# Patient Record
Sex: Female | Born: 1945 | Race: White | Hispanic: No | Marital: Married | Smoking: Former smoker
Health system: Southern US, Community
[De-identification: ages and names within clinical notes are randomized; demographics above are authoritative.]

## PROBLEM LIST (undated history)

## (undated) DIAGNOSIS — E7800 Pure hypercholesterolemia, unspecified: Secondary | ICD-10-CM

## (undated) DIAGNOSIS — G4730 Sleep apnea, unspecified: Secondary | ICD-10-CM

## (undated) DIAGNOSIS — K219 Gastro-esophageal reflux disease without esophagitis: Secondary | ICD-10-CM

## (undated) DIAGNOSIS — M1990 Unspecified osteoarthritis, unspecified site: Secondary | ICD-10-CM

## (undated) DIAGNOSIS — E119 Type 2 diabetes mellitus without complications: Secondary | ICD-10-CM

## (undated) DIAGNOSIS — M4316 Spondylolisthesis, lumbar region: Secondary | ICD-10-CM

## (undated) DIAGNOSIS — I10 Essential (primary) hypertension: Secondary | ICD-10-CM

## (undated) DIAGNOSIS — C801 Malignant (primary) neoplasm, unspecified: Secondary | ICD-10-CM

## (undated) HISTORY — PX: EYE SURGERY: SHX253

## (undated) HISTORY — PX: COLONOSCOPY: SHX174

## (undated) HISTORY — PX: TONSILLECTOMY: SUR1361

## (undated) HISTORY — PX: OTHER SURGICAL HISTORY: SHX169

## (undated) HISTORY — PX: APPENDECTOMY: SHX54

## (undated) HISTORY — PX: ABDOMINAL HYSTERECTOMY: SHX81

---

## 2004-08-23 DIAGNOSIS — C801 Malignant (primary) neoplasm, unspecified: Secondary | ICD-10-CM

## 2004-08-23 HISTORY — DX: Malignant (primary) neoplasm, unspecified: C80.1

## 2016-02-09 ENCOUNTER — Telehealth: Payer: Self-pay

## 2016-02-09 ENCOUNTER — Other Ambulatory Visit: Payer: Self-pay

## 2016-02-09 DIAGNOSIS — Z1211 Encounter for screening for malignant neoplasm of colon: Secondary | ICD-10-CM

## 2016-02-09 MED ORDER — PEG 3350-KCL-NA BICARB-NACL 420 G PO SOLR: 4000.0000 mL | ORAL | Status: DC

## 2016-02-09 NOTE — Telephone Encounter (Signed)
Ok to schedule.

## 2016-02-09 NOTE — Telephone Encounter (Signed)
Gastroenterology Pre-Procedure Review  Request Date: Requesting Physician:   PATIENT REVIEW QUESTIONS: The patient responded to the following health history questions as indicated:    1. Diabetes Melitis: NO 2. Joint replacements in the past 12 months: NO 3. Major health problems in the past 3 months: NO 4. Has an artificial valve or MVP: NO 5. Has a defibrillator: NO 6. Has been advised in past to take antibiotics in advance of a procedure like teeth cleaning: NO 7. Family history of colon cancer: NO 8. Alcohol Use: NO 9. History of sleep apnea: NO    MEDICATIONS & ALLERGIES:    Patient reports the following regarding taking any blood thinners:   Plavix? NO Aspirin? YES Coumadin? NO  Patient confirms/reports the following medications:  Current Outpatient Prescriptions  Medication Sig Dispense Refill  . aspirin 81 MG tablet Take 81 mg by mouth daily.    . DULoxetine (CYMBALTA) 30 MG capsule Take 30 mg by mouth daily.    Marland Kitchen estradiol (ESTRACE) 2 MG tablet Take 2 mg by mouth daily.    Marland Kitchen losartan-hydrochlorothiazide (HYZAAR) 50-12.5 MG tablet Take 1 tablet by mouth daily.    . Omega-3 Fatty Acids (OMEGA-3 FISH OIL PO) Take by mouth.    Marland Kitchen omeprazole (PRILOSEC) 40 MG capsule Take 40 mg by mouth daily.    . simvastatin (ZOCOR) 40 MG tablet Take 40 mg by mouth daily.     No current facility-administered medications for this visit.    Patient confirms/reports the following allergies:  Allergies not on file  No orders of the defined types were placed in this encounter.    AUTHORIZATION INFORMATION Primary Insurance: Medicare,  ID #[id number],  Group #:  Pre-Cert / Auth required:  Pre-Cert / Auth #:   Secondary Insurance: Arrie Aran,  [place] #[id number],  Group #: [place] Pre-Cert / Auth required:  Pre-Cert / Auth #:   SCHEDULE INFORMATION: Procedure has been scheduled as follows:  Date: , Time:   Location:   This Gastroenterology Pre-Precedure Review Form is being routed  to the following provider:

## 2016-02-09 NOTE — Telephone Encounter (Signed)
Pt is set up for  02/18/16 @ 1:00 pm. Instructions are in the mail and she is aware of date and time.

## 2016-02-12 ENCOUNTER — Telehealth: Payer: Self-pay

## 2016-02-12 NOTE — Telephone Encounter (Signed)
Tried to call with no answer  

## 2016-02-18 ENCOUNTER — Encounter (HOSPITAL_COMMUNITY): Payer: Self-pay | Admitting: *Deleted

## 2016-02-18 ENCOUNTER — Encounter (HOSPITAL_COMMUNITY)
Admission: RE | Disposition: A | Discharge status: Home / Self Care | Payer: Self-pay | Source: Ambulatory Visit | Attending: Gastroenterology

## 2016-02-18 ENCOUNTER — Ambulatory Visit (HOSPITAL_COMMUNITY)
Admission: RE | Admit: 2016-02-18 | Discharge: 2016-02-18 | Disposition: A | Discharge status: Home / Self Care | Payer: Medicare Other | Source: Ambulatory Visit | Attending: Gastroenterology | Admitting: Gastroenterology

## 2016-02-18 DIAGNOSIS — Q438 Other specified congenital malformations of intestine: Secondary | ICD-10-CM | POA: Diagnosis not present

## 2016-02-18 DIAGNOSIS — K5730 Diverticulosis of large intestine without perforation or abscess without bleeding: Secondary | ICD-10-CM | POA: Insufficient documentation

## 2016-02-18 DIAGNOSIS — Z1211 Encounter for screening for malignant neoplasm of colon: Secondary | ICD-10-CM | POA: Insufficient documentation

## 2016-02-18 DIAGNOSIS — E7800 Pure hypercholesterolemia, unspecified: Secondary | ICD-10-CM | POA: Insufficient documentation

## 2016-02-18 DIAGNOSIS — Z7982 Long term (current) use of aspirin: Secondary | ICD-10-CM | POA: Diagnosis not present

## 2016-02-18 DIAGNOSIS — K219 Gastro-esophageal reflux disease without esophagitis: Secondary | ICD-10-CM | POA: Insufficient documentation

## 2016-02-18 DIAGNOSIS — Z8542 Personal history of malignant neoplasm of other parts of uterus: Secondary | ICD-10-CM | POA: Diagnosis not present

## 2016-02-18 DIAGNOSIS — I10 Essential (primary) hypertension: Secondary | ICD-10-CM | POA: Diagnosis not present

## 2016-02-18 DIAGNOSIS — K648 Other hemorrhoids: Secondary | ICD-10-CM | POA: Diagnosis not present

## 2016-02-18 DIAGNOSIS — Z79899 Other long term (current) drug therapy: Secondary | ICD-10-CM | POA: Diagnosis not present

## 2016-02-18 DIAGNOSIS — Z87891 Personal history of nicotine dependence: Secondary | ICD-10-CM | POA: Diagnosis not present

## 2016-02-18 DIAGNOSIS — K621 Rectal polyp: Secondary | ICD-10-CM | POA: Diagnosis not present

## 2016-02-18 HISTORY — DX: Pure hypercholesterolemia, unspecified: E78.00

## 2016-02-18 HISTORY — DX: Malignant (primary) neoplasm, unspecified: C80.1

## 2016-02-18 HISTORY — DX: Essential (primary) hypertension: I10

## 2016-02-18 HISTORY — PX: COLONOSCOPY: SHX5424

## 2016-02-18 HISTORY — DX: Gastro-esophageal reflux disease without esophagitis: K21.9

## 2016-02-18 SURGERY — COLONOSCOPY
Anesthesia: Moderate Sedation

## 2016-02-18 MED ORDER — SODIUM CHLORIDE 0.9 % IV SOLN
INTRAVENOUS | Status: DC
  Administered 2016-02-18: 12:00:00 via INTRAVENOUS

## 2016-02-18 MED ORDER — MEPERIDINE HCL 100 MG/ML IJ SOLN
INTRAMUSCULAR | Status: AC
  Filled 2016-02-18: qty 2

## 2016-02-18 MED ORDER — MIDAZOLAM HCL 5 MG/5ML IJ SOLN
INTRAMUSCULAR | Status: DC
Start: 2016-02-18 — End: 2016-02-18
  Filled 2016-02-18: qty 10

## 2016-02-18 MED ORDER — MIDAZOLAM HCL 5 MG/5ML IJ SOLN
INTRAMUSCULAR | Status: DC | PRN
  Administered 2016-02-18 (×2): 2 mg via INTRAVENOUS
  Administered 2016-02-18 (×2): 1 mg via INTRAVENOUS

## 2016-02-18 MED ORDER — STERILE WATER FOR IRRIGATION IR SOLN
Status: DC | PRN
  Administered 2016-02-18: 13:00:00

## 2016-02-18 MED ORDER — MEPERIDINE HCL 100 MG/ML IJ SOLN
INTRAMUSCULAR | Status: DC | PRN
  Administered 2016-02-18: 50 mg via INTRAVENOUS
  Administered 2016-02-18 (×2): 25 mg via INTRAVENOUS

## 2016-02-18 NOTE — Discharge Instructions (Signed)
You have small internal hemorrhoids and diverticulosis IN YOUR LEFT COLON. YOU HAD TWO SMALL POLYPS REMOVED.   DRINK WATER TO KEEP YOUR URINE LIGHT YELLOW.  FOLLOW A HIGH FIBER DIET. AVOID ITEMS THAT CAUSE BLOATING. See info below.  CONTINUE YOUR WEIGHT LOSS EFFORTS. LOSE TEN POUNDS. YOUR BODY MASS INDEX IS OVER 30 WHICH MEANS YOU ARE OBESE. OBESITY IS ASSOCIATED WITH AN INCREASE FOR ALL CANCERS, INCLUDING ESOPHAGEAL AND COLON CANCER.  Next colonoscopy in 5-10 years.  Colonoscopy Care After Read the instructions outlined below and refer to this sheet in the next week. These discharge instructions provide you with general information on caring for yourself after you leave the hospital. While your treatment has been planned according to the most current medical practices available, unavoidable complications occasionally occur. If you have any problems or questions after discharge, call DR. Kelyn Koskela, [phone].  ACTIVITY  You may resume your regular activity, but move at a slower pace for the next 24 hours.   Take frequent rest periods for the next 24 hours.   Walking will help get rid of the air and reduce the bloated feeling in your belly (abdomen).   No driving for 24 hours (because of the medicine (anesthesia) used during the test).   You may shower.   Do not sign any important legal documents or operate any machinery for 24 hours (because of the anesthesia used during the test).    NUTRITION  Drink plenty of fluids.   You may resume your normal diet as instructed by your doctor.   Begin with a light meal and progress to your normal diet. Heavy or fried foods are harder to digest and may make you feel sick to your stomach (nauseated).   Avoid alcoholic beverages for 24 hours or as instructed.    MEDICATIONS  You may resume your normal medications.   WHAT YOU CAN EXPECT TODAY  Some feelings of bloating in the abdomen.   Passage of more gas than usual.   Spotting  of blood in your stool or on the toilet paper  .  IF YOU HAD POLYPS REMOVED DURING THE COLONOSCOPY:  Eat a soft diet IF YOU HAVE NAUSEA, BLOATING, ABDOMINAL PAIN, OR VOMITING.    FINDING OUT THE RESULTS OF YOUR TEST Not all test results are available during your visit. DR. Oneida Alar WILL CALL YOU WITHIN 14 DAYS OF YOUR PROCEDUE WITH YOUR RESULTS. Do not assume everything is normal if you have not heard from DR. Jaryiah Mehlman, CALL HER OFFICE AT [phone].  SEEK IMMEDIATE MEDICAL ATTENTION AND CALL THE OFFICE: [phone] IF:  You have more than a spotting of blood in your stool.   Your belly is swollen (abdominal distention).   You are nauseated or vomiting.   You have a temperature over 101F.   You have abdominal pain or discomfort that is severe or gets worse throughout the day.  High-Fiber Diet A high-fiber diet changes your normal diet to include more whole grains, legumes, fruits, and vegetables. Changes in the diet involve replacing refined carbohydrates with unrefined foods. The calorie level of the diet is essentially unchanged. The Dietary Reference Intake (recommended amount) for adult males is 38 grams per day. For adult females, it is 25 grams per day. Pregnant and lactating women should consume 28 grams of fiber per day. Fiber is the intact part of a plant that is not broken down during digestion. Functional fiber is fiber that has been isolated from the plant to provide a beneficial effect  in the body. PURPOSE  Increase stool bulk.   Ease and regulate bowel movements.   Lower cholesterol.  REDUCE RISK OF COLON CANCER  INDICATIONS THAT YOU NEED MORE FIBER  Constipation and hemorrhoids.   Uncomplicated diverticulosis (intestine condition) and irritable bowel syndrome.   Weight management.   As a protective measure against hardening of the arteries (atherosclerosis), diabetes, and cancer.   GUIDELINES FOR INCREASING FIBER IN THE DIET  Start adding fiber to the diet  slowly. A gradual increase of about 5 more grams (2 slices of whole-wheat bread, 2 servings of most fruits or vegetables, or 1 bowl of high-fiber cereal) per day is best. Too rapid an increase in fiber may result in constipation, flatulence, and bloating.   Drink enough water and fluids to keep your urine clear or pale yellow. Water, juice, or caffeine-free drinks are recommended. Not drinking enough fluid may cause constipation.   Eat a variety of high-fiber foods rather than one type of fiber.   Try to increase your intake of fiber through using high-fiber foods rather than fiber pills or supplements that contain small amounts of fiber.   The goal is to change the types of food eaten. Do not supplement your present diet with high-fiber foods, but replace foods in your present diet.   INCLUDE A VARIETY OF FIBER SOURCES  Replace refined and processed grains with whole grains, canned fruits with fresh fruits, and incorporate other fiber sources. White rice, white breads, and most bakery goods contain little or no fiber.   Brown whole-grain rice, buckwheat oats, and many fruits and vegetables are all good sources of fiber. These include: broccoli, Brussels sprouts, cabbage, cauliflower, beets, sweet potatoes, white potatoes (skin on), carrots, tomatoes, eggplant, squash, berries, fresh fruits, and dried fruits.   Cereals appear to be the richest source of fiber. Cereal fiber is found in whole grains and bran. Bran is the fiber-rich outer coat of cereal grain, which is largely removed in refining. In whole-grain cereals, the bran remains. In breakfast cereals, the largest amount of fiber is found in those with "bran" in their names. The fiber content is sometimes indicated on the label.   You may need to include additional fruits and vegetables each day.   In baking, for 1 cup white flour, you may use the following substitutions:   1 cup whole-wheat flour minus 2 tablespoons.   1/2 cup white  flour plus 1/2 cup whole-wheat flour.   Polyps, Colon  A polyp is extra tissue that grows inside your body. Colon polyps grow in the large intestine. The large intestine, also called the colon, is part of your digestive system. It is a long, hollow tube at the end of your digestive tract where your body makes and stores stool. Most polyps are not dangerous. They are benign. This means they are not cancerous. But over time, some types of polyps can turn into cancer. Polyps that are smaller than a pea are usually not harmful. But larger polyps could someday become or may already be cancerous. To be safe, doctors remove all polyps and test them.   PREVENTION There is not one sure way to prevent polyps. You might be able to lower your risk of getting them if you:  Eat more fruits and vegetables and less fatty food.   Do not smoke.   Avoid alcohol.   Exercise every day.   Lose weight if you are overweight.   Eating more calcium and folate can also lower your  risk of getting polyps. Some foods that are rich in calcium are milk, cheese, and broccoli. Some foods that are rich in folate are chickpeas, kidney beans, and spinach.    Diverticulosis Diverticulosis is a common condition that develops when small pouches (diverticula) form in the wall of the colon. The risk of diverticulosis increases with age. It happens more often in people who eat a low-fiber diet. Most individuals with diverticulosis have no symptoms. Those individuals with symptoms usually experience belly (abdominal) pain, constipation, or loose stools (diarrhea).  HOME CARE INSTRUCTIONS  Increase the amount of fiber in your diet as directed by your caregiver or dietician. This may reduce symptoms of diverticulosis.   Drink at least 6 to 8 glasses of water each day to prevent constipation.   Try not to strain when you have a bowel movement.   Avoiding nuts and seeds to prevent complications is NOT NECESSARY.   FOODS HAVING  HIGH FIBER CONTENT INCLUDE:  Fruits. Apple, peach, pear, tangerine, raisins, prunes.   Vegetables. Brussels sprouts, asparagus, broccoli, cabbage, carrot, cauliflower, romaine lettuce, spinach, summer squash, tomato, winter squash, zucchini.   Starchy Vegetables. Baked beans, kidney beans, lima beans, split peas, lentils, potatoes (with skin).   Grains. Whole wheat bread, brown rice, bran flake cereal, plain oatmeal, white rice, shredded wheat, bran muffins.    SEEK IMMEDIATE MEDICAL CARE IF:  You develop increasing pain or severe bloating.   You have an oral temperature above 101F.   You develop vomiting or bowel movements that are bloody or black.   Hemorrhoids Hemorrhoids are dilated (enlarged) veins around the rectum. Sometimes clots will form in the veins. This makes them swollen and painful. These are called thrombosed hemorrhoids. Causes of hemorrhoids include:  Constipation.   Straining to have a bowel movement.   HEAVY LIFTING  HOME CARE INSTRUCTIONS  Eat a well balanced diet and drink 6 to 8 glasses of water every day to avoid constipation. You may also use a bulk laxative.   Avoid straining to have bowel movements.   Keep anal area dry and clean.   Do not use a donut shaped pillow or sit on the toilet for long periods. This increases blood pooling and pain.   Move your bowels when your body has the urge; this will require less straining and will decrease pain and pressure.

## 2016-02-18 NOTE — Op Note (Signed)
[facility] Patient Name: Phyllis Bell Procedure Date: 02/18/2016 12:42 PM MRN: [id number] Date of Birth: 01-02-1946 Attending MD: Barney Drain , MD CSN: [id number] Age: 70 Admit Type: Outpatient Procedure:                Colonoscopy WITH COLD FORCEPS POLYPECTOMY Indications:              Screening for colorectal malignant neoplasm Providers:                Barney Drain, MD, Otis Peak B. Gwenlyn Perking RN, RN, Randa Spike, Technician Referring MD:             out of [facility] Medicines:                Meperidine 100 mg IV, Midazolam 6 mg IV Complications:            No immediate complications. Estimated Blood Loss:     Estimated blood loss was minimal. Procedure:                Pre-Anesthesia Assessment:                           - Prior to the procedure, a History and Physical                            was performed, and patient medications and                            allergies were reviewed. The patient's tolerance of                            previous anesthesia was also reviewed. The risks                            and benefits of the procedure and the sedation                            options and risks were discussed with the patient.                            All questions were answered, and informed consent                            was obtained. Prior Anticoagulants: The patient has                            taken aspirin, last dose was day of procedure. ASA                            Grade Assessment: II - A patient with mild systemic                            disease. After reviewing the risks and benefits,  the patient was deemed in satisfactory condition to                            undergo the procedure. After obtaining informed                            consent, the colonoscope was passed under direct                            vision. Throughout the procedure, the patient's                            blood  pressure, pulse, and oxygen saturations were                            monitored continuously. The [id number]                            scope was introduced through the anus and advanced                            to the the cecum, identified by appendiceal orifice                            and ileocecal valve. The colonoscopy was somewhat                            difficult due to significant looping. Successful                            completion of the procedure was aided by changing                            the patient to a supine position, using manual                            pressure and COLOWRAP. The ileocecal valve,                            appendiceal orifice, and rectum were photographed.                            The patient tolerated the procedure fairly well.                            The quality of the bowel preparation was excellent. Scope In: 1:16:47 PM Scope Out: 1:46:44 PM Scope Withdrawal Time: 0 hours 10 minutes 39 seconds  Total Procedure Duration: 0 hours 29 minutes 57 seconds  Findings:      The recto-sigmoid colon, sigmoid colon and transverse colon were       moderately redundant.      Two sessile polyps were found in the rectum. The polyps were 2 to 3 mm       in size. These polyps were removed  with a cold biopsy forceps. Resection       and retrieval were complete.      Many small and large-mouthed diverticula were found in the sigmoid colon.      Non-bleeding internal hemorrhoids were found. The hemorrhoids were small. Impression:               - Redundant colon.                           - Two 2 to 3 mm polyps in the rectum, removed with                            a cold biopsy forceps. Resected and retrieved.                           - Diverticulosis in the sigmoid colon.                           - Non-bleeding internal hemorrhoids. Moderate Sedation:      Moderate (conscious) sedation was administered by the endoscopy nurse        and supervised by the endoscopist. The following parameters were       monitored: oxygen saturation, heart rate, blood pressure, and response       to care. Total physician intraservice time was 49 minutes. Recommendation:           - High fiber diet.                           - Continue present medications.                           - Await pathology results.                           - Repeat colonoscopy in 5-10 years for surveillance.                           - Patient has a contact number available for                            emergencies. The signs and symptoms of potential                            delayed complications were discussed with the                            patient. Return to normal activities tomorrow.                            Written discharge instructions were provided to the                            patient. Procedure Code(s):        --- Professional ---  [id number], Colonoscopy, flexible; with biopsy, single                            or multiple                           99152, Moderate sedation services provided by the                            same physician or other qualified health care                            professional performing the diagnostic or                            therapeutic service that the sedation supports,                            requiring the presence of an independent trained                            observer to assist in the monitoring of the                            patient's level of consciousness and physiological                            status; initial 15 minutes of intraservice time,                            patient age 17 years or older                           [phone], Moderate sedation services; each additional                            15 minutes intraservice time                           99153, Moderate sedation services; each additional                            15 minutes  intraservice time Diagnosis Code(s):        --- Professional ---                           Z12.11, Encounter for screening for malignant                            neoplasm of colon                           K62.1, Rectal polyp                           K64.8, Other hemorrhoids  K57.30, Diverticulosis of large intestine without                            perforation or abscess without bleeding                           Q43.8, Other specified congenital malformations of                            intestine CPT copyright 2016 [employer]. All rights reserved. The codes documented in this report are preliminary and upon coder review may  be revised to meet current compliance requirements. Barney Drain, MD Barney Drain, MD 02/18/2016 5:53:23 PM This report has been signed electronically. Number of Addenda: 0

## 2016-02-18 NOTE — H&P (Signed)
  Primary Care Physician:  Pcp Not In System Primary Gastroenterologist:  Dr. Oneida Alar  Pre-Procedure History & Physical: HPI:  Phyllis Bell is a 70 y.o. female here for [place].  Past Medical History  Diagnosis Date  . Hypertension   . Hypercholesteremia   . GERD (gastroesophageal reflux disease)   . Cancer [facility]) 2006    Uterine cancer    Past Surgical History  Procedure Laterality Date  . Abdominal hysterectomy    . Incisional hernia repair with mesh    . Colonoscopy      Prior to Admission medications   Medication Sig Start Date End Date Taking? Authorizing Provider  aspirin 81 MG tablet Take 81 mg by mouth daily.   Yes Historical Provider, MD  DULoxetine (CYMBALTA) 30 MG capsule Take 30 mg by mouth daily.   Yes Historical Provider, MD  estradiol (ESTRACE) 2 MG tablet Take 2 mg by mouth daily.   Yes Historical Provider, MD  losartan-hydrochlorothiazide (HYZAAR) 50-12.5 MG tablet Take 1 tablet by mouth daily.   Yes Historical Provider, MD  meloxicam (MOBIC) 15 MG tablet Take 15 mg by mouth daily.   Yes Historical Provider, MD  Omega-3 Fatty Acids (OMEGA-3 FISH OIL PO) Take 2 capsules by mouth daily.    Yes Historical Provider, MD  omeprazole (PRILOSEC) 40 MG capsule Take 40 mg by mouth daily.   Yes Historical Provider, MD  polyethylene glycol-electrolytes (TRILYTE) 420 g solution Take 4,000 mLs by mouth as directed. 02/09/16  Yes Danie Binder, MD  simvastatin (ZOCOR) 40 MG tablet Take 40 mg by mouth daily.   Yes Historical Provider, MD    Allergies as of 02/09/2016  . (Not on File)    History reviewed. No pertinent family history.  Social History   Social History  . Marital Status: Married    Spouse Name: N/A  . Number of Children: N/A  . Years of Education: N/A   Occupational History  . Not on file.   Social History Main Topics  . Smoking status: Former Smoker -- 1.00 packs/day for 20 years    Types: Cigarettes  . Smokeless tobacco: Not on  file  . Alcohol Use: No  . Drug Use: No  . Sexual Activity: Not on file   Other Topics Concern  . Not on file   Social History Narrative  . No narrative on file    Review of Systems: See HPI, otherwise negative ROS   Physical Exam: BP 134/85 mmHg  Pulse 63  Temp(Src) 97.7 F (36.5 C) (Oral)  Resp 13  Ht 6' (1.829 m)  Wt 224 lb (101.606 kg)  BMI 30.37 kg/m2  SpO2 99% General:   Alert,  pleasant and cooperative in NAD Head:  Normocephalic and atraumatic. Neck:  Supple; Lungs:  Clear throughout to auscultation.    Heart:  Regular rate and rhythm. Abdomen:  Soft, nontender and nondistended. Normal bowel sounds, without guarding, and without rebound.   Neurologic:  Alert and  oriented x4;  grossly normal neurologically.  Impression/Plan:     SCREENING  Plan:  1. TCS TODAY

## 2016-02-23 ENCOUNTER — Encounter (HOSPITAL_COMMUNITY): Payer: Self-pay | Admitting: Gastroenterology

## 2016-03-09 ENCOUNTER — Telehealth: Payer: Self-pay | Admitting: Gastroenterology

## 2016-03-09 NOTE — Telephone Encounter (Signed)
Please call pt. She had HYPERPLASTIC POLYPS removed.   DRINK WATER TO KEEP YOUR URINE LIGHT YELLOW.  FOLLOW A HIGH FIBER DIET. AVOID ITEMS THAT CAUSE BLOATING.   CONTINUE YOUR WEIGHT LOSS EFFORTS. LOSE TEN POUNDS.   Next colonoscopy in 10 years.

## 2016-03-10 NOTE — Telephone Encounter (Signed)
Tried to call with no answer  

## 2016-03-10 NOTE — Telephone Encounter (Signed)
Reminder in epic °

## 2016-03-11 NOTE — Telephone Encounter (Signed)
Pt is aware of results. I printed the info she needed to sign up for MY CHART and mailed to her.

## 2017-06-23 ENCOUNTER — Other Ambulatory Visit: Payer: Self-pay | Admitting: Neurosurgery

## 2017-06-23 DIAGNOSIS — M48062 Spinal stenosis, lumbar region with neurogenic claudication: Secondary | ICD-10-CM

## 2017-07-11 ENCOUNTER — Ambulatory Visit
Admission: RE | Admit: 2017-07-11 | Discharge: 2017-07-11 | Disposition: A | Discharge status: Home / Self Care | Payer: Medicare Other | Source: Ambulatory Visit | Attending: Neurosurgery | Admitting: Neurosurgery

## 2017-07-11 DIAGNOSIS — M48062 Spinal stenosis, lumbar region with neurogenic claudication: Secondary | ICD-10-CM

## 2017-08-08 ENCOUNTER — Other Ambulatory Visit: Payer: Self-pay | Admitting: Neurosurgery

## 2017-09-16 NOTE — Pre-Procedure Instructions (Signed)
Phyllis Bell  09/16/2017      [id number] - [address], [place] [address] Phone: [phone] Fax: [phone]  [id number] - [address] [place] [address] [place] [address] Phone: [phone] Fax: [phone]  [id number] [address], [place] [address] [place] [address] Phone: [phone] Fax: [phone]    Your procedure is scheduled on February 5  Report to [place] at [place].M.  Call this number if you have problems the morning of surgery:  [phone]   Remember:  Do not eat food or drink liquids after midnight.  Continue all medications as directed by your physician except follow these medication instructions before surgery below    Take these medicines the morning of surgery with A SIP OF WATER  omeprazole (PRILOSEC)  7 days prior to surgery STOP taking any Aspirin(unless otherwise instructed by your surgeon), Aleve, Naproxen, Ibuprofen, Motrin, Advil, Goody's, BC's, all herbal medications, fish oil, and all vitamins meloxicam (MOBIC)   Follow your doctors instructions regarding your Aspirin.  If no instructions were given by your doctor, then you will need to call the prescribing office office to get instructions.     WHAT DO I DO ABOUT MY DIABETES MEDICATION?   Marland Kitchen Do not take oral diabetes medicines (pills) the morning of surgery. metFORMIN (GLUCOPHAGE)     How to Manage Your Diabetes Before and After Surgery  Why is it important to control my blood sugar before and after surgery? . Improving blood sugar levels before and after surgery helps healing and can limit problems. . A way of improving blood sugar control is eating a healthy diet by: o  Eating less sugar and carbohydrates o  Increasing activity/exercise o  Talking with your doctor about reaching  your blood sugar goals . High blood sugars (greater than 180 mg/dL) can raise your risk of infections and slow your recovery, so you will need to focus on controlling your diabetes during the weeks before surgery. . Make sure that the doctor who takes care of your diabetes knows about your planned surgery including the date and location.  How do I manage my blood sugar before surgery? . Check your blood sugar at least 4 times a day, starting 2 days before surgery, to make sure that the level is not too high or low. o Check your blood sugar the morning of your surgery when you wake up and every 2 hours until you get to the Short Stay unit. . If your blood sugar is less than 70 mg/dL, you will need to treat for low blood sugar: o Do not take insulin. o Treat a low blood sugar (less than 70 mg/dL) with  cup of clear juice (cranberry or apple), 4 glucose tablets, OR glucose gel. o Recheck blood sugar in 15 minutes after treatment (to make sure it is greater than 70 mg/dL). If your blood sugar is not greater than 70 mg/dL on recheck, call [phone] for further instructions. . Report your blood sugar to the short stay nurse when you get to Short Stay.  . If you are admitted to the hospital after surgery: o Your blood sugar will be checked by the staff and you will probably be given insulin after surgery (instead of oral diabetes medicines) to make sure you have good blood  sugar levels. o The goal for blood sugar control after surgery is 80-180 mg/dL.   Do not wear jewelry, make-up or nail polish.  Do not wear lotions, powders, or perfumes, or deodorant.  Do not shave 48 hours prior to surgery.  Men may shave face and neck.  Do not bring valuables to the hospital.  [facility] is not responsible for any belongings or valuables.  Contacts, dentures or bridgework may not be worn into surgery.  Leave your suitcase in the car.  After surgery it may be brought to your room.  For patients admitted to  the hospital, discharge time will be determined by your treatment team.  Patients discharged the day of surgery will not be allowed to drive home.    Special instructions:   [place]- Preparing For Surgery  Before surgery, you can play an important role. Because skin is not sterile, your skin needs to be as free of germs as possible. You can reduce the number of germs on your skin by washing with CHG (chlorahexidine gluconate) Soap before surgery.  CHG is an antiseptic cleaner which kills germs and bonds with the skin to continue killing germs even after washing.  Please do not use if you have an allergy to CHG or antibacterial soaps. If your skin becomes reddened/irritated stop using the CHG.  Do not shave (including legs and underarms) for at least 48 hours prior to first CHG shower. It is OK to shave your face.  Please follow these instructions carefully.   1. Shower the NIGHT BEFORE SURGERY and the MORNING OF SURGERY with CHG.   2. If you chose to wash your hair, wash your hair first as usual with your normal shampoo.  3. After you shampoo, rinse your hair and body thoroughly to remove the shampoo.  4. Use CHG as you would any other liquid soap. You can apply CHG directly to the skin and wash gently with a scrungie or a clean washcloth.   5. Apply the CHG Soap to your body ONLY FROM THE NECK DOWN.  Do not use on open wounds or open sores. Avoid contact with your eyes, ears, mouth and genitals (private parts). Wash Face and genitals (private parts)  with your normal soap.  6. Wash thoroughly, paying special attention to the area where your surgery will be performed.  7. Thoroughly rinse your body with warm water from the neck down.  8. DO NOT shower/wash with your normal soap after using and rinsing off the CHG Soap.  9. Pat yourself dry with a CLEAN TOWEL.  10. Wear CLEAN PAJAMAS to bed the night before surgery, wear comfortable clothes the morning of surgery  11. Place  CLEAN SHEETS on your bed the night of your first shower and DO NOT SLEEP WITH PETS.    Day of Surgery: Do not apply any deodorants/lotions. Please wear clean clothes to the hospital/surgery center.      Please read over the following fact sheets that you were given.

## 2017-09-19 ENCOUNTER — Encounter (HOSPITAL_COMMUNITY): Payer: Self-pay

## 2017-09-19 ENCOUNTER — Other Ambulatory Visit: Payer: Self-pay

## 2017-09-19 ENCOUNTER — Encounter (HOSPITAL_COMMUNITY)
Admission: RE | Admit: 2017-09-19 | Discharge: 2017-09-19 | Disposition: A | Discharge status: Home / Self Care | Payer: Medicare Other | Source: Ambulatory Visit | Attending: Neurosurgery | Admitting: Neurosurgery

## 2017-09-19 DIAGNOSIS — Z01818 Encounter for other preprocedural examination: Secondary | ICD-10-CM | POA: Diagnosis present

## 2017-09-19 DIAGNOSIS — E119 Type 2 diabetes mellitus without complications: Secondary | ICD-10-CM | POA: Diagnosis not present

## 2017-09-19 DIAGNOSIS — Z7984 Long term (current) use of oral hypoglycemic drugs: Secondary | ICD-10-CM | POA: Diagnosis not present

## 2017-09-19 DIAGNOSIS — Z87891 Personal history of nicotine dependence: Secondary | ICD-10-CM | POA: Diagnosis not present

## 2017-09-19 DIAGNOSIS — Z7982 Long term (current) use of aspirin: Secondary | ICD-10-CM | POA: Diagnosis not present

## 2017-09-19 DIAGNOSIS — I10 Essential (primary) hypertension: Secondary | ICD-10-CM | POA: Diagnosis not present

## 2017-09-19 DIAGNOSIS — Z8542 Personal history of malignant neoplasm of other parts of uterus: Secondary | ICD-10-CM | POA: Diagnosis not present

## 2017-09-19 DIAGNOSIS — Z79899 Other long term (current) drug therapy: Secondary | ICD-10-CM | POA: Diagnosis not present

## 2017-09-19 DIAGNOSIS — E785 Hyperlipidemia, unspecified: Secondary | ICD-10-CM | POA: Diagnosis not present

## 2017-09-19 DIAGNOSIS — K219 Gastro-esophageal reflux disease without esophagitis: Secondary | ICD-10-CM | POA: Diagnosis not present

## 2017-09-19 HISTORY — DX: Spondylolisthesis, lumbar region: M43.16

## 2017-09-19 HISTORY — DX: Unspecified osteoarthritis, unspecified site: M19.90

## 2017-09-19 HISTORY — DX: Type 2 diabetes mellitus without complications: E11.9

## 2017-09-19 HISTORY — DX: Sleep apnea, unspecified: G47.30

## 2017-09-19 LAB — CBC
HEMATOCRIT: 42.8 % (ref 36.0–46.0)
Hemoglobin: 13.7 g/dL (ref 12.0–15.0)
MCH: 29.9 pg (ref 26.0–34.0)
MCHC: 32 g/dL (ref 30.0–36.0)
MCV: 93.4 fL (ref 78.0–100.0)
PLATELETS: 280 10*3/uL (ref 150–400)
RBC: 4.58 MIL/uL (ref 3.87–5.11)
RDW: 15 % (ref 11.5–15.5)
WBC: 8.3 10*3/uL (ref 4.0–10.5)

## 2017-09-19 LAB — BASIC METABOLIC PANEL
Anion gap: 12 (ref 5–15)
BUN: 14 mg/dL (ref 6–20)
CHLORIDE: 102 mmol/L (ref 101–111)
CO2: 25 mmol/L (ref 22–32)
CREATININE: 0.88 mg/dL (ref 0.44–1.00)
Calcium: 9.7 mg/dL (ref 8.9–10.3)
GFR calc Af Amer: >60 mL/min (ref >60)
GFR calc non Af Amer: >60 mL/min (ref >60)
Glucose, Bld: 147 mg/dL — ABNORMAL HIGH (ref 65–99)
POTASSIUM: 4 mmol/L (ref 3.5–5.1)
Sodium: 139 mmol/L (ref 135–145)

## 2017-09-19 LAB — SURGICAL PCR SCREEN
MRSA, PCR: NEGATIVE
STAPHYLOCOCCUS AUREUS: POSITIVE — AB

## 2017-09-19 LAB — GLUCOSE, CAPILLARY: Glucose-Capillary: 140 mg/dL — ABNORMAL HIGH (ref 65–99)

## 2017-09-19 LAB — ABO/RH: ABO/RH(D): A POS

## 2017-09-19 LAB — HEMOGLOBIN A1C
Hgb A1c MFr Bld: 6.7 % — ABNORMAL HIGH (ref 4.8–5.6)
Mean Plasma Glucose: 145.59 mg/dL

## 2017-09-19 NOTE — Progress Notes (Signed)
PCP - Dr. Madaline Guthrie  Cardiologist - Dr. Sondra Come- [facility]- Requested records  Chest x-ray - Denies  EKG - Requested- DOS if not received  Stress Test - Requested  ECHO - Requested  Cardiac Cath - Denies  Sleep Study - Yes- Positive CPAP - Yes  LABS- 09/19/17: CBC, BMP, HA1C, T/S  HA1C- 09/19/17 Fasting Blood Sugar - 119-122, Today 140 Checks Blood Sugar ___1__ a week  Anesthesia- Yes- Requested records  Pt denies having chest pain, sob, or fever at this time. All instructions explained to the pt, with a verbal understanding of the material. Pt agrees to go over the instructions while at home for a better understanding. The opportunity to ask questions was provided.

## 2017-09-20 LAB — TYPE AND SCREEN
ABO/RH(D): A POS
ANTIBODY SCREEN: NEGATIVE

## 2017-09-21 NOTE — Progress Notes (Addendum)
Anesthesia Chart Review:  Pt is a 72 year old female scheduled for L4-5 maximum access PLIF with resection of synovial cyst on 09/27/2017 with Erline Levine, MD  - PCP is Earney Mallet, MD - Cardiologist is Donn Pierini, MD. Last office visit 06/06/17  PMH includes:  HTN, DM, hyperlipidemia, uterine cancer, GERD. Former smoker. BMI 30  Medications include: ASA 81 mg, losartan-HCTZ, metformin, Prilosec, simvastatin  BP 119/61   Pulse 68   Temp (!) 36.4 C   Resp 20   Ht 6' (1.829 m)   Wt 220 lb 1.6 oz (99.8 kg)   SpO2 98%   BMI 29.85 kg/m   Preoperative labs reviewed.   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 LAFB.    Echo 04/2016 (by notes):  - Normal LV size, wall motion, function, EF 60-65%.  Normal pulmonary pressure and no significant valvular disease  Nuclear stress test 05/17/16 [facility]): 1.  Normal perfusion.  No ischemia. 2.  Normal LV wall motion and function.  EF 66%.  If no changes, I anticipate pt can proceed with surgery as scheduled.   Willeen Cass, FNP-BC [facility]/Anesthesiology Phone: [phone] 09/23/2017 3:51 PM

## 2017-09-24 NOTE — H&P (Signed)
Patient ID:   [id number] Patient: Phyllis Bell  Date of Birth: Aug 15, 1946 Visit Type: Office Visit   Date: 08/31/2017 01:15 PM Provider: Marchia Meiers. Vertell Limber MD   This 72 year old female presents for back pain.   History of Present Illness: 1.  back pain  Patient returns noting 2 days of relief following injection. She complains the pain now radiates down the right leg to the calf. Her daughter reports she walks with a limp because of severe right leg pain.           MEDICATIONS(added, continued or stopped this visit): Started Medication Directions Instruction Stopped   aspirin 81 mg tablet,delayed release take 1 tablet by oral route  every day     duloxetine 30 mg capsule,delayed release take 1 capsule by oral route  every day     estradiol 2 mg tablet take 1 tablet by oral route  every day     Fish Oil 1,000 mg (120 mg-180 mg) capsule take 1 by oral route  every day     losartan 50 mg-hydrochlorothiazide 12.5 mg tablet take 1 tablet by oral route  every day     meloxicam take 1 tablet by oral route  every 2 days    08/03/2017 Norco 5 mg-325 mg tablet take 1 tablet by oral route  every 6 hours as needed for pain     omeprazole 40 mg capsule,delayed release take 1 capsule by oral route  every day before a meal     simvastatin 40 mg tablet take 1 tablet by oral route  every day in the evening       ALLERGIES: Ingredient Reaction Medication Name Comment  NO KNOWN ALLERGIES     No known allergies.    Vitals Date Temp F BP Pulse Ht In Wt Lb BMI BSA Pain Score  08/31/2017  158/84 71 72 214 29.02  8/10      IMPRESSION Patient returns noting no relief from injections and worsening right leg pain. 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 On confrontational testing, negative Patrick's on the right, positive right seated SLR. Patient will proceed with decompression and fusion of L4-5.      Pain  Management Plan Location: back. Onset: 07/09/2017. Duration: varies. Quality: discomforting. Pain management follow-up plan of care: Patient taking medication as prescribed..  Schedule MAS PLIF L4-5 on 09/27/17. Fit for LSO brace. Nurse education given.             Provider:  Vertell Limber MD, Marchia Meiers 08/31/2017 1:42 PM  Dictation edited by: Lucita Lora    CC Providers: Ihor Gully [address] [place]  [address]-   Raffaella Edison MD  [address] [place], [place] [address]              Electronically signed by Marchia Meiers. Vertell Limber MD on 09/02/2017 04:43 PM  Patient ID:   [id number] Patient: Phyllis Bell  Date of Birth: Dec 25, 1945 Visit Type: Office Visit   Date: 07/11/2017 11:30 AM Provider: Marchia Meiers. Vertell Limber MD   This 72 year old female presents for back pain.   History of Present Illness: 1.  back pain  Patient notes an episode of back pain on Saturday and today is the first day she has gotten out of bed. Pain continues to radiate from the lumbar spine to the right hip and thigh. She notes 10 minutes of standing results in  a stabbing pain in the back. Patient notes a cyst on her spine since 2017. She manages the pain with Meloxicam and Tylenol twice per day. Patient notes occasional episodes of dizziness on standing.         Medical/Surgical/Interim History Reviewed, no change.  Last detailed document date:04/21/2016.     Family History: Reviewed, no changes.  Last detailed document date:04/21/2016.   Social History: Reviewed, no changes. Last detailed document date: 04/21/2016.    MEDICATIONS(added, continued or stopped this visit): Started Medication Directions Instruction Stopped   aspirin 81 mg tablet,delayed release take 1 tablet by oral route  every day     duloxetine 30 mg capsule,delayed release take 1 capsule by oral route  every day     estradiol 2 mg tablet take 1 tablet by oral route  every day     Fish Oil  1,000 mg (120 mg-180 mg) capsule take 1 by oral route  every day     losartan 50 mg-hydrochlorothiazide 12.5 mg tablet take 1 tablet by oral route  every day     meloxicam take 1 tablet by oral route  every 2 days     omeprazole 40 mg capsule,delayed release take 1 capsule by oral route  every day before a meal     simvastatin 40 mg tablet take 1 tablet by oral route  every day in the evening       ALLERGIES: Ingredient Reaction Medication Name Comment  NO KNOWN ALLERGIES     No known allergies. Reviewed, no changes.    Vitals Date Temp F BP Pulse Ht In Wt Lb BMI BSA Pain Score  07/11/2017  127/84 64 72 214.2 29.05  10/10      IMPRESSION Patient presents with back pain radiating to the right hip and thigh. X-ray shows levoconvex lumbar scoliosis and listhesis of L4 on L5 (10 mm on neutral, 9 mm on extension, 11 mm on flexion). 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 On confrontational testing, no pain with palpation along the spine. I explained this could be a CSF leak or cyst off the facet joint. Schedule cyst aspiration with Dr. Maryjean Ka.  Assessment/Plan # Detail Type Description   1. 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           Pain Management Plan Pain Scale: 10/10. Method: Numeric Pain Intensity Scale. Location: back. Onset: 07/09/2017. Duration: varies. Quality: discomforting. Pain management follow-up plan of care: Patient taking medication as prescribed..  Schedule aspiration of the cyst with Dr. Maryjean Ka today. Follow-up after aspiration.             Provider:  Vertell Limber MD, Marchia Meiers 07/11/2017 12:53 PM  Dictation edited by: Lucita Lora    CC Providers: Ihor Gully [address] [place]  [address]-   Jahon Bart MD  [address] [place], [place] [address]              Electronically signed by Marchia Meiers. Vertell Limber MD on 07/17/2017 02:07 PM  Patient ID:    [id number] Patient: Phyllis Bell  Date of Birth: [birth year] Visit Type: Office Visit   Date: 06/20/2017 02:15 PM Provider: Marchia Meiers. Vertell Limber MD   This 72 year old female presents for back pain.   History of Present Illness: 1.  back pain  Patient presents with back pain radiating to the right hip and mid thigh. She grades her current pain as 7/10. She notes pain was  10/10 over the summer. She gets relief from laying down. Pain worsens with sitting or standing for prolonged periods.  Physical: Pain with palpation to the right greater trochanteric bursa. Full strength in lower extremities. Reflexes are symmetric. Negative right Patrick's. Negative right seated SLR.         Medical/Surgical/Interim History Reviewed, no change.  Last detailed document date:04/21/2016.     Family History: Reviewed, no changes.  Last detailed document date:04/21/2016.   Social History: Reviewed, no changes. Last detailed document date: 04/21/2016.    MEDICATIONS(added, continued or stopped this visit): Started Medication Directions Instruction Stopped   aspirin 81 mg tablet,delayed release take 1 tablet by oral route  every day     duloxetine 30 mg capsule,delayed release take 1 capsule by oral route  every day     estradiol 2 mg tablet take 1 tablet by oral route  every day     Fish Oil 1,000 mg (120 mg-180 mg) capsule take 1 by oral route  every day     losartan 50 mg-hydrochlorothiazide 12.5 mg tablet take 1 tablet by oral route  every day     meloxicam take 1 tablet by oral route  every 2 days     omeprazole 40 mg capsule,delayed release take 1 capsule by oral route  every day before a meal     simvastatin 40 mg tablet take 1 tablet by oral route  every day in the evening       ALLERGIES: Ingredient Reaction Medication Name Comment  NO KNOWN ALLERGIES     No known allergies.    Vitals Date Temp F BP Pulse Ht In Wt Lb BMI BSA Pain Score  06/20/2017  142/86 62 72 219.8 29.81   8/10      IMPRESSION Patient presents with lumbar and right hip pain radiating to the mid thigh. On confrontational testing, pain with palpation over the right greater trochanteric bursa and negative seated SLR on the right. Schedule lumbar MRI.     Pain Management Plan Pain Scale: 8/10. Method: Numeric Pain Intensity Scale. Location: back. Onset: 01/25/2017. Duration: varies. Quality: discomforting. Pain management follow-up plan of care: Patient taking medications as prescribed.  Schedule lumbar MRI. Follow-up after MRI with x-rays.             Provider:  Vertell Limber MD, Marchia Meiers 06/20/2017 3:16 PM  Dictation edited by: Lucita Lora    CC Providers: Ihor Gully [address] [place]  [address]   Keni Elison MD  [address] [place], [place] [address]              Electronically signed by Marchia Meiers. Vertell Limber MD on 06/21/2017 03:32 PM

## 2017-09-27 ENCOUNTER — Encounter (HOSPITAL_COMMUNITY)
Admission: RE | Disposition: A | Discharge status: Home / Self Care | Payer: Self-pay | Source: Ambulatory Visit | Attending: Neurosurgery

## 2017-09-27 ENCOUNTER — Inpatient Hospital Stay (HOSPITAL_COMMUNITY): Payer: Medicare Other | Admitting: Certified Registered"

## 2017-09-27 ENCOUNTER — Inpatient Hospital Stay (HOSPITAL_COMMUNITY)
Admission: RE | Admit: 2017-09-27 | Discharge: 2017-09-28 | DRG: 460 | Disposition: A | Discharge status: Home / Self Care | Payer: Medicare Other | Source: Ambulatory Visit | Attending: Neurosurgery | Admitting: Neurosurgery

## 2017-09-27 ENCOUNTER — Inpatient Hospital Stay (HOSPITAL_COMMUNITY): Payer: Medicare Other | Admitting: Emergency Medicine

## 2017-09-27 ENCOUNTER — Encounter (HOSPITAL_COMMUNITY): Payer: Self-pay | Admitting: Urology

## 2017-09-27 ENCOUNTER — Inpatient Hospital Stay (HOSPITAL_COMMUNITY): Payer: Medicare Other

## 2017-09-27 DIAGNOSIS — M7138 Other bursal cyst, other site: Secondary | ICD-10-CM | POA: Diagnosis present

## 2017-09-27 DIAGNOSIS — Z7982 Long term (current) use of aspirin: Secondary | ICD-10-CM | POA: Diagnosis not present

## 2017-09-27 DIAGNOSIS — M4316 Spondylolisthesis, lumbar region: Secondary | ICD-10-CM | POA: Diagnosis present

## 2017-09-27 DIAGNOSIS — M5416 Radiculopathy, lumbar region: Secondary | ICD-10-CM | POA: Diagnosis present

## 2017-09-27 DIAGNOSIS — M419 Scoliosis, unspecified: Secondary | ICD-10-CM | POA: Diagnosis present

## 2017-09-27 DIAGNOSIS — M48061 Spinal stenosis, lumbar region without neurogenic claudication: Secondary | ICD-10-CM | POA: Diagnosis present

## 2017-09-27 DIAGNOSIS — Z419 Encounter for procedure for purposes other than remedying health state, unspecified: Secondary | ICD-10-CM

## 2017-09-27 HISTORY — PX: MAXIMUM ACCESS (MAS)POSTERIOR LUMBAR INTERBODY FUSION (PLIF) 1 LEVEL: SHX6368

## 2017-09-27 LAB — GLUCOSE, CAPILLARY
GLUCOSE-CAPILLARY: 304 mg/dL — AB (ref 65–99)
Glucose-Capillary: 132 mg/dL — ABNORMAL HIGH (ref 65–99)
Glucose-Capillary: 158 mg/dL — ABNORMAL HIGH (ref 65–99)
Glucose-Capillary: 169 mg/dL — ABNORMAL HIGH (ref 65–99)
Glucose-Capillary: 233 mg/dL — ABNORMAL HIGH (ref 65–99)

## 2017-09-27 SURGERY — FOR MAXIMUM ACCESS (MAS) POSTERIOR LUMBAR INTERBODY FUSION (PLIF) 1 LEVEL
Anesthesia: General | Site: Back

## 2017-09-27 MED ORDER — CEFAZOLIN SODIUM-DEXTROSE 2-4 GM/100ML-% IV SOLN
2.0000 g | Freq: Three times a day (TID) | INTRAVENOUS | Status: AC
  Administered 2017-09-27 (×2): 2 g via INTRAVENOUS
  Filled 2017-09-27 (×2): qty 100

## 2017-09-27 MED ORDER — POLYETHYLENE GLYCOL 3350 17 G PO PACK: 17.0000 g | PACK | Freq: Every day | ORAL | Status: DC | PRN

## 2017-09-27 MED ORDER — LIDOCAINE-EPINEPHRINE 1 %-1:100000 IJ SOLN
INTRAMUSCULAR | Status: DC | PRN
  Administered 2017-09-27: 5 mL

## 2017-09-27 MED ORDER — MORPHINE SULFATE (PF) 4 MG/ML IV SOLN: 2.0000 mg | INTRAVENOUS | Status: DC | PRN

## 2017-09-27 MED ORDER — SUCCINYLCHOLINE CHLORIDE 20 MG/ML IJ SOLN
INTRAMUSCULAR | Status: DC | PRN
  Administered 2017-09-27: 170 mg via INTRAVENOUS

## 2017-09-27 MED ORDER — SUGAMMADEX SODIUM 200 MG/2ML IV SOLN
INTRAVENOUS | Status: AC
  Filled 2017-09-27: qty 2

## 2017-09-27 MED ORDER — ONDANSETRON HCL 4 MG/2ML IJ SOLN: 4.0000 mg | Freq: Once | INTRAMUSCULAR | Status: DC | PRN

## 2017-09-27 MED ORDER — PHENOL 1.4 % MT LIQD: 1.0000 | OROMUCOSAL | Status: DC | PRN

## 2017-09-27 MED ORDER — KCL IN DEXTROSE-NACL 20-5-0.45 MEQ/L-%-% IV SOLN: INTRAVENOUS | Status: DC

## 2017-09-27 MED ORDER — LIDOCAINE HCL (CARDIAC) 20 MG/ML IV SOLN
INTRAVENOUS | Status: DC | PRN
  Administered 2017-09-27: 100 mg via INTRAVENOUS

## 2017-09-27 MED ORDER — LACTATED RINGERS IV SOLN
INTRAVENOUS | Status: DC | PRN
  Administered 2017-09-27 (×2): via INTRAVENOUS

## 2017-09-27 MED ORDER — 0.9 % SODIUM CHLORIDE (POUR BTL) OPTIME
TOPICAL | Status: DC | PRN
  Administered 2017-09-27: 1000 mL

## 2017-09-27 MED ORDER — METHOCARBAMOL 1000 MG/10ML IJ SOLN
500.0000 mg | Freq: Four times a day (QID) | INTRAVENOUS | Status: DC | PRN
  Filled 2017-09-27: qty 5

## 2017-09-27 MED ORDER — BUPIVACAINE HCL (PF) 0.5 % IJ SOLN
INTRAMUSCULAR | Status: DC | PRN
  Administered 2017-09-27: 5 mL

## 2017-09-27 MED ORDER — THROMBIN (RECOMBINANT) 5000 UNITS EX SOLR
OROMUCOSAL | Status: DC | PRN
  Administered 2017-09-27: 08:00:00 via TOPICAL

## 2017-09-27 MED ORDER — FLEET ENEMA 7-19 GM/118ML RE ENEM: 1.0000 | ENEMA | Freq: Once | RECTAL | Status: DC | PRN

## 2017-09-27 MED ORDER — DEXAMETHASONE SODIUM PHOSPHATE 10 MG/ML IJ SOLN
INTRAMUSCULAR | Status: AC
  Filled 2017-09-27: qty 1

## 2017-09-27 MED ORDER — ONDANSETRON HCL 4 MG PO TABS: 4.0000 mg | ORAL_TABLET | Freq: Four times a day (QID) | ORAL | Status: DC | PRN

## 2017-09-27 MED ORDER — PANTOPRAZOLE SODIUM 40 MG PO TBEC
40.0000 mg | DELAYED_RELEASE_TABLET | Freq: Every day | ORAL | Status: DC
  Administered 2017-09-28: 40 mg via ORAL
  Filled 2017-09-27: qty 1

## 2017-09-27 MED ORDER — SODIUM CHLORIDE 0.9% FLUSH: 3.0000 mL | INTRAVENOUS | Status: DC | PRN

## 2017-09-27 MED ORDER — ONDANSETRON HCL 4 MG/2ML IJ SOLN
INTRAMUSCULAR | Status: DC | PRN
  Administered 2017-09-27: 4 mg via INTRAVENOUS

## 2017-09-27 MED ORDER — PANTOPRAZOLE SODIUM 40 MG IV SOLR: 40.0000 mg | Freq: Every day | INTRAVENOUS | Status: DC

## 2017-09-27 MED ORDER — MIDAZOLAM HCL 5 MG/5ML IJ SOLN
INTRAMUSCULAR | Status: DC | PRN
  Administered 2017-09-27: 2 mg via INTRAVENOUS

## 2017-09-27 MED ORDER — ONDANSETRON HCL 4 MG/2ML IJ SOLN: 4.0000 mg | Freq: Four times a day (QID) | INTRAMUSCULAR | Status: DC | PRN

## 2017-09-27 MED ORDER — PROPOFOL 10 MG/ML IV BOLUS
INTRAVENOUS | Status: DC | PRN
  Administered 2017-09-27: 120 mg via INTRAVENOUS

## 2017-09-27 MED ORDER — ONDANSETRON HCL 4 MG/2ML IJ SOLN
INTRAMUSCULAR | Status: AC
  Filled 2017-09-27: qty 2

## 2017-09-27 MED ORDER — SIMVASTATIN 20 MG PO TABS
40.0000 mg | ORAL_TABLET | Freq: Every day | ORAL | Status: DC
  Administered 2017-09-27: 40 mg via ORAL
  Filled 2017-09-27: qty 2

## 2017-09-27 MED ORDER — ACETAMINOPHEN 325 MG PO TABS: 650.0000 mg | ORAL_TABLET | ORAL | Status: DC | PRN

## 2017-09-27 MED ORDER — SODIUM CHLORIDE 0.9% FLUSH
3.0000 mL | Freq: Two times a day (BID) | INTRAVENOUS | Status: DC
  Administered 2017-09-27 (×2): 3 mL via INTRAVENOUS

## 2017-09-27 MED ORDER — CEFAZOLIN SODIUM-DEXTROSE 2-4 GM/100ML-% IV SOLN
INTRAVENOUS | Status: AC
  Filled 2017-09-27: qty 100

## 2017-09-27 MED ORDER — BUPIVACAINE HCL (PF) 0.5 % IJ SOLN
INTRAMUSCULAR | Status: AC
  Filled 2017-09-27: qty 30

## 2017-09-27 MED ORDER — FENTANYL CITRATE (PF) 250 MCG/5ML IJ SOLN
INTRAMUSCULAR | Status: AC
  Filled 2017-09-27: qty 5

## 2017-09-27 MED ORDER — EPHEDRINE SULFATE 50 MG/ML IJ SOLN
INTRAMUSCULAR | Status: DC | PRN
  Administered 2017-09-27 (×2): 15 mg via INTRAVENOUS
  Administered 2017-09-27: 10 mg via INTRAVENOUS

## 2017-09-27 MED ORDER — BISACODYL 10 MG RE SUPP
10.0000 mg | Freq: Every day | RECTAL | Status: DC | PRN
Start: 2017-09-27 — End: 2017-09-28

## 2017-09-27 MED ORDER — LOSARTAN POTASSIUM 50 MG PO TABS
50.0000 mg | ORAL_TABLET | Freq: Every day | ORAL | Status: DC
  Administered 2017-09-27: 50 mg via ORAL
  Filled 2017-09-27 (×2): qty 1

## 2017-09-27 MED ORDER — ZOLPIDEM TARTRATE 5 MG PO TABS
5.0000 mg | ORAL_TABLET | Freq: Every evening | ORAL | Status: DC | PRN
Start: 2017-09-27 — End: 2017-09-28

## 2017-09-27 MED ORDER — CEFAZOLIN SODIUM-DEXTROSE 2-3 GM-%(50ML) IV SOLR
INTRAVENOUS | Status: DC | PRN
  Administered 2017-09-27: 2 g via INTRAVENOUS

## 2017-09-27 MED ORDER — OXYCODONE HCL 5 MG PO TABS: 5.0000 mg | ORAL_TABLET | ORAL | Status: DC | PRN

## 2017-09-27 MED ORDER — PHENYLEPHRINE HCL 10 MG/ML IJ SOLN
INTRAMUSCULAR | Status: DC | PRN
  Administered 2017-09-27: 120 ug via INTRAVENOUS

## 2017-09-27 MED ORDER — MIDAZOLAM HCL 2 MG/2ML IJ SOLN
INTRAMUSCULAR | Status: AC
  Filled 2017-09-27: qty 2

## 2017-09-27 MED ORDER — LIDOCAINE-EPINEPHRINE 1 %-1:100000 IJ SOLN
INTRAMUSCULAR | Status: AC
  Filled 2017-09-27: qty 1

## 2017-09-27 MED ORDER — ACETAMINOPHEN 650 MG RE SUPP: 650.0000 mg | RECTAL | Status: DC | PRN

## 2017-09-27 MED ORDER — ALUM & MAG HYDROXIDE-SIMETH 200-200-20 MG/5ML PO SUSP: 30.0000 mL | Freq: Four times a day (QID) | ORAL | Status: DC | PRN

## 2017-09-27 MED ORDER — BUPIVACAINE LIPOSOME 1.3 % IJ SUSP
20.0000 mL | Freq: Once | INTRAMUSCULAR | Status: AC
  Administered 2017-09-27: 20 mL
  Filled 2017-09-27: qty 20

## 2017-09-27 MED ORDER — METHOCARBAMOL 500 MG PO TABS
500.0000 mg | ORAL_TABLET | Freq: Four times a day (QID) | ORAL | Status: DC | PRN
  Administered 2017-09-27 – 2017-09-28 (×3): 500 mg via ORAL
  Filled 2017-09-27 (×2): qty 1

## 2017-09-27 MED ORDER — HYDROCHLOROTHIAZIDE 12.5 MG PO CAPS
12.5000 mg | ORAL_CAPSULE | Freq: Every day | ORAL | Status: DC
  Administered 2017-09-27: 12.5 mg via ORAL
  Filled 2017-09-27 (×2): qty 1

## 2017-09-27 MED ORDER — ROCURONIUM BROMIDE 10 MG/ML (PF) SYRINGE
PREFILLED_SYRINGE | INTRAVENOUS | Status: AC
  Filled 2017-09-27: qty 5

## 2017-09-27 MED ORDER — LIDOCAINE 2% (20 MG/ML) 5 ML SYRINGE
INTRAMUSCULAR | Status: AC
  Filled 2017-09-27: qty 5

## 2017-09-27 MED ORDER — PROPOFOL 10 MG/ML IV BOLUS
INTRAVENOUS | Status: AC
  Filled 2017-09-27: qty 20

## 2017-09-27 MED ORDER — LOSARTAN POTASSIUM-HCTZ 50-12.5 MG PO TABS: 1.0000 | ORAL_TABLET | Freq: Every day | ORAL | Status: DC

## 2017-09-27 MED ORDER — HYDROCODONE-ACETAMINOPHEN 5-325 MG PO TABS
2.0000 | ORAL_TABLET | ORAL | Status: DC | PRN
  Administered 2017-09-27 – 2017-09-28 (×5): 2 via ORAL
  Filled 2017-09-27 (×5): qty 2

## 2017-09-27 MED ORDER — PHENYLEPHRINE HCL 10 MG/ML IJ SOLN
INTRAVENOUS | Status: DC | PRN
  Administered 2017-09-27: 30 ug/min via INTRAVENOUS

## 2017-09-27 MED ORDER — SUCCINYLCHOLINE CHLORIDE 200 MG/10ML IV SOSY
PREFILLED_SYRINGE | INTRAVENOUS | Status: AC
  Filled 2017-09-27: qty 10

## 2017-09-27 MED ORDER — THROMBIN 5000 UNITS EX SOLR
CUTANEOUS | Status: AC
  Filled 2017-09-27: qty 5000

## 2017-09-27 MED ORDER — DOCUSATE SODIUM 100 MG PO CAPS
100.0000 mg | ORAL_CAPSULE | Freq: Two times a day (BID) | ORAL | Status: DC
  Administered 2017-09-27 – 2017-09-28 (×2): 100 mg via ORAL
  Filled 2017-09-27 (×2): qty 1

## 2017-09-27 MED ORDER — MEPERIDINE HCL 25 MG/ML IJ SOLN: 6.2500 mg | INTRAMUSCULAR | Status: DC | PRN

## 2017-09-27 MED ORDER — EPHEDRINE 5 MG/ML INJ
INTRAVENOUS | Status: AC
  Filled 2017-09-27: qty 10

## 2017-09-27 MED ORDER — HYDROMORPHONE HCL 1 MG/ML IJ SOLN
INTRAMUSCULAR | Status: AC
  Filled 2017-09-27: qty 1

## 2017-09-27 MED ORDER — MENTHOL 3 MG MT LOZG: 1.0000 | LOZENGE | OROMUCOSAL | Status: DC | PRN

## 2017-09-27 MED ORDER — THROMBIN (RECOMBINANT) 20000 UNITS EX SOLR
CUTANEOUS | Status: DC | PRN
  Administered 2017-09-27: 08:00:00 via TOPICAL

## 2017-09-27 MED ORDER — DEXAMETHASONE SODIUM PHOSPHATE 10 MG/ML IJ SOLN
INTRAMUSCULAR | Status: DC | PRN
  Administered 2017-09-27: 10 mg via INTRAVENOUS

## 2017-09-27 MED ORDER — ALBUMIN HUMAN 5 % IV SOLN
INTRAVENOUS | Status: DC | PRN
  Administered 2017-09-27: 09:00:00 via INTRAVENOUS

## 2017-09-27 MED ORDER — HYDROMORPHONE HCL 1 MG/ML IJ SOLN
0.2500 mg | INTRAMUSCULAR | Status: DC | PRN
  Administered 2017-09-27 (×2): 0.5 mg via INTRAVENOUS

## 2017-09-27 MED ORDER — THROMBIN 20000 UNITS EX SOLR
CUTANEOUS | Status: AC
  Filled 2017-09-27: qty 20000

## 2017-09-27 MED ORDER — METFORMIN HCL 500 MG PO TABS
500.0000 mg | ORAL_TABLET | Freq: Two times a day (BID) | ORAL | Status: DC
  Administered 2017-09-27 – 2017-09-28 (×2): 500 mg via ORAL
  Filled 2017-09-27 (×2): qty 1

## 2017-09-27 MED ORDER — ASPIRIN EC 81 MG PO TBEC
81.0000 mg | DELAYED_RELEASE_TABLET | Freq: Every day | ORAL | Status: DC
  Administered 2017-09-27: 81 mg via ORAL
  Filled 2017-09-27: qty 1

## 2017-09-27 MED ORDER — ACETAMINOPHEN 500 MG PO TABS: 1000.0000 mg | ORAL_TABLET | Freq: Four times a day (QID) | ORAL | Status: DC | PRN

## 2017-09-27 MED ORDER — FENTANYL CITRATE (PF) 100 MCG/2ML IJ SOLN
INTRAMUSCULAR | Status: DC | PRN
  Administered 2017-09-27 (×2): 50 ug via INTRAVENOUS
  Administered 2017-09-27: 150 ug via INTRAVENOUS
  Administered 2017-09-27 (×2): 50 ug via INTRAVENOUS

## 2017-09-27 SURGICAL SUPPLY — 80 items
BASKET BONE COLLECTION (BASKET) ×3 IMPLANT
BLADE CLIPPER SURG (BLADE) IMPLANT
BONE CANC CHIPS 20CC PCAN1/4 (Bone Implant) ×3 IMPLANT
BUR MATCHSTICK NEURO 3.0 LAGG (BURR) ×3 IMPLANT
BUR ROUND FLUTED 5 RND (BURR) ×2 IMPLANT
BUR ROUND FLUTED 5MM RND (BURR) ×1
CAGE COROENT PLIF 10X28-8 LUMB (Cage) ×6 IMPLANT
CANISTER SUCT 3000ML PPV (MISCELLANEOUS) ×3 IMPLANT
CAP RELINE MOD TULIP RMM (Cap) ×6 IMPLANT
CARTRIDGE OIL MAESTRO DRILL (MISCELLANEOUS) ×1 IMPLANT
CHIPS CANC BONE 20CC PCAN1/4 (Bone Implant) ×1 IMPLANT
CLIP NEUROVISION LG (CLIP) ×3 IMPLANT
CONT SPEC 4OZ CLIKSEAL STRL BL (MISCELLANEOUS) ×6 IMPLANT
COVER BACK TABLE 24X17X13 BIG (DRAPES) IMPLANT
COVER BACK TABLE 60X90IN (DRAPES) ×3 IMPLANT
DECANTER SPIKE VIAL GLASS SM (MISCELLANEOUS) ×3 IMPLANT
DERMABOND ADVANCED (GAUZE/BANDAGES/DRESSINGS) ×2
DERMABOND ADVANCED .7 DNX12 (GAUZE/BANDAGES/DRESSINGS) ×1 IMPLANT
DIFFUSER DRILL AIR PNEUMATIC (MISCELLANEOUS) ×3 IMPLANT
DRAPE C-ARM 42X72 X-RAY (DRAPES) ×3 IMPLANT
DRAPE C-ARMOR (DRAPES) ×3 IMPLANT
DRAPE LAPAROTOMY 100X72X124 (DRAPES) ×3 IMPLANT
DRAPE POUCH INSTRU U-SHP 10X18 (DRAPES) IMPLANT
DRAPE SURG 17X23 STRL (DRAPES) ×3 IMPLANT
DRSG OPSITE POSTOP 4X6 (GAUZE/BANDAGES/DRESSINGS) ×3 IMPLANT
DURAPREP 26ML APPLICATOR (WOUND CARE) ×3 IMPLANT
ELECT BLADE 4.0 EZ CLEAN MEGAD (MISCELLANEOUS) ×3
ELECT REM PT RETURN 9FT ADLT (ELECTROSURGICAL) ×3
ELECTRODE BLDE 4.0 EZ CLN MEGD (MISCELLANEOUS) ×1 IMPLANT
ELECTRODE REM PT RTRN 9FT ADLT (ELECTROSURGICAL) ×1 IMPLANT
EVACUATOR 1/8 PVC DRAIN (DRAIN) IMPLANT
GAUZE SPONGE 4X4 12PLY STRL (GAUZE/BANDAGES/DRESSINGS) ×3 IMPLANT
GAUZE SPONGE 4X4 16PLY XRAY LF (GAUZE/BANDAGES/DRESSINGS) IMPLANT
GLOVE BIO SURGEON STRL SZ8 (GLOVE) ×3 IMPLANT
GLOVE BIOGEL PI IND STRL 8 (GLOVE) ×1 IMPLANT
GLOVE BIOGEL PI IND STRL 8.5 (GLOVE) ×1 IMPLANT
GLOVE BIOGEL PI INDICATOR 8 (GLOVE) ×2
GLOVE BIOGEL PI INDICATOR 8.5 (GLOVE) ×2
GLOVE ECLIPSE 8.0 STRL XLNG CF (GLOVE) ×3 IMPLANT
GLOVE EXAM NITRILE LRG STRL (GLOVE) IMPLANT
GLOVE EXAM NITRILE XL STR (GLOVE) IMPLANT
GLOVE EXAM NITRILE XS STR PU (GLOVE) IMPLANT
GLOVE SURG SS PI 7.5 STRL IVOR (GLOVE) ×9 IMPLANT
GOWN STRL REUS W/ TWL LRG LVL3 (GOWN DISPOSABLE) IMPLANT
GOWN STRL REUS W/ TWL XL LVL3 (GOWN DISPOSABLE) ×1 IMPLANT
GOWN STRL REUS W/TWL 2XL LVL3 (GOWN DISPOSABLE) ×3 IMPLANT
GOWN STRL REUS W/TWL LRG LVL3 (GOWN DISPOSABLE)
GOWN STRL REUS W/TWL XL LVL3 (GOWN DISPOSABLE) ×2
KIT BASIN OR (CUSTOM PROCEDURE TRAY) ×3 IMPLANT
KIT POSITION SURG JACKSON T1 (MISCELLANEOUS) ×3 IMPLANT
KIT ROOM TURNOVER OR (KITS) ×3 IMPLANT
MARKER SKIN DUAL TIP RULER LAB (MISCELLANEOUS) ×3 IMPLANT
MODULE NVM5 NEXT GEN EMG (NEEDLE) ×3 IMPLANT
NEEDLE HYPO 21X1.5 SAFETY (NEEDLE) ×3 IMPLANT
NEEDLE HYPO 25X1 1.5 SAFETY (NEEDLE) ×3 IMPLANT
NEEDLE SPNL 18GX3.5 QUINCKE PK (NEEDLE) ×3 IMPLANT
NS IRRIG 1000ML POUR BTL (IV SOLUTION) ×3 IMPLANT
OIL CARTRIDGE MAESTRO DRILL (MISCELLANEOUS) ×3
PACK LAMINECTOMY NEURO (CUSTOM PROCEDURE TRAY) ×3 IMPLANT
PAD ARMBOARD 7.5X6 YLW CONV (MISCELLANEOUS) ×9 IMPLANT
PATTIES SURGICAL .5 X.5 (GAUZE/BANDAGES/DRESSINGS) IMPLANT
PATTIES SURGICAL .5 X1 (DISPOSABLE) IMPLANT
PATTIES SURGICAL 1X1 (DISPOSABLE) IMPLANT
ROD RELINE COCR LORD 5.0X35 (Rod) ×6 IMPLANT
SCREW LOCK RSS 4.5/5.0MM (Screw) ×12 IMPLANT
SCREW POLY RMM 5.5X40 4S (Screw) ×6 IMPLANT
SCREW SHANK RELINE MOD 5.5X35 (Screw) ×6 IMPLANT
SPONGE LAP 4X18 X RAY DECT (DISPOSABLE) IMPLANT
SPONGE SURGIFOAM ABS GEL 100 (HEMOSTASIS) ×3 IMPLANT
STAPLER SKIN PROX WIDE 3.9 (STAPLE) IMPLANT
SUT VIC AB 1 CT1 18XBRD ANBCTR (SUTURE) ×1 IMPLANT
SUT VIC AB 1 CT1 8-18 (SUTURE) ×2
SUT VIC AB 2-0 CT1 18 (SUTURE) ×3 IMPLANT
SUT VIC AB 3-0 SH 8-18 (SUTURE) ×6 IMPLANT
SYR 20CC LL (SYRINGE) ×3 IMPLANT
SYR 5ML LL (SYRINGE) IMPLANT
TOWEL GREEN STERILE (TOWEL DISPOSABLE) ×3 IMPLANT
TOWEL GREEN STERILE FF (TOWEL DISPOSABLE) ×3 IMPLANT
TRAY FOLEY W/METER SILVER 16FR (SET/KITS/TRAYS/PACK) ×3 IMPLANT
WATER STERILE IRR 1000ML POUR (IV SOLUTION) ×3 IMPLANT

## 2017-09-27 NOTE — Anesthesia Preprocedure Evaluation (Signed)
Anesthesia Evaluation  Patient identified by MRN, date of birth, ID band Patient awake    Reviewed: Allergy & Precautions, NPO status , Patient's Chart, lab work & pertinent test results  Airway Mallampati: I  TM Distance: >3 FB Neck ROM: Full    Dental   Pulmonary sleep apnea , former smoker,    Pulmonary exam normal        Cardiovascular hypertension, Pt. on medications Normal cardiovascular exam     Neuro/Psych    GI/Hepatic GERD  Medicated and Controlled,  Endo/Other  diabetes, Type 2, Oral Hypoglycemic Agents  Renal/GU      Musculoskeletal   Abdominal   Peds  Hematology   Anesthesia Other Findings   Reproductive/Obstetrics                             Anesthesia Physical Anesthesia Plan  ASA: II  Anesthesia Plan: General   Post-op Pain Management:    Induction: Intravenous  PONV Risk Score and Plan: 3 and Ondansetron, Dexamethasone and Midazolam  Airway Management Planned: Oral ETT  Additional Equipment:   Intra-op Plan:   Post-operative Plan: Extubation in OR  Informed Consent: I have reviewed the patients History and Physical, chart, labs and discussed the procedure including the risks, benefits and alternatives for the proposed anesthesia with the patient or authorized representative who has indicated his/her understanding and acceptance.     Plan Discussed with: CRNA and Surgeon  Anesthesia Plan Comments:         Anesthesia Quick Evaluation

## 2017-09-27 NOTE — Interval H&P Note (Signed)
History and Physical Interval Note:  09/27/2017 7:14 AM  Phyllis Bell  has presented today for surgery, with the diagnosis of Spondylolisthesis, lumbar region  The various methods of treatment have been discussed with the patient and family. After consideration of risks, benefits and other options for treatment, the patient has consented to  Procedure(s) with comments: L4-5 Maximum access posterior lumbar interbody fusion with resection of synovial cyst (N/A) - L4-5 Maximum access posterior lumbar interbody fusion with resection of synovial cyst as a surgical intervention .  The patient's history has been reviewed, patient examined, no change in status, stable for surgery.  I have reviewed the patient's chart and labs.  Questions were answered to the patient's satisfaction.     Sencere Symonette D

## 2017-09-27 NOTE — Evaluation (Signed)
Physical Therapy Evaluation Patient Details Name: Phyllis Bell MRN: [id number] DOB: [birth year] Today's Date: 09/27/2017   History of Present Illness  patient is a 72 yo female s/p MASPLIF L 45 level with posterolateral arthrodesis  Clinical Impression  Patient seen for mobility assessment s/p spinal surgery. Mobilize well. Educated patient on precautions, mobility expectations, safety and car transfers. No further acute PT needs. Will sign off.     Follow Up Recommendations No PT follow up    Equipment Recommendations  None recommended by PT    Recommendations for Other Services       Precautions / Restrictions Precautions Precautions: Back Precaution Booklet Issued: Yes (comment) Precaution Comments: verbally reviewed with patient Required Braces or Orthoses: Spinal Brace Spinal Brace: Lumbar corset      Mobility  Bed Mobility Overal bed mobility: Needs Assistance Bed Mobility: Rolling;Sidelying to Sit;Sit to Sidelying Rolling: Supervision Sidelying to sit: Supervision     Sit to sidelying: Supervision    Transfers Overall transfer level: Needs assistance   Transfers: Sit to/from Stand Sit to Stand: Supervision         General transfer comment: supervision for safety. encouragement to perform in one motion  Ambulation/Gait Ambulation/Gait assistance: Supervision Ambulation Distance (Feet): 340 Feet Assistive device: None Gait Pattern/deviations: Step-through pattern;Decreased stride length Gait velocity: decreased Gait velocity interpretation: Below normal speed for age/gender General Gait Details: patient with good stability, VCs for increased cadence  Stairs Stairs: Yes Stairs assistance: Supervision Stair Management: One rail Left Number of Stairs: 2 General stair comments: No physical assist required, VCs for technique  Wheelchair Mobility    Modified Rankin (Stroke Patients Only)       Balance Overall balance assessment: No apparent  balance deficits (not formally assessed)                                           Pertinent Vitals/Pain Pain Assessment: Faces Faces Pain Scale: Hurts little more Pain Location: back Pain Descriptors / Indicators: Operative site guarding Pain Intervention(s): Monitored during session    Home Living Family/patient expects to be discharged to:: Private residence Living Arrangements: Spouse/significant other Available Help at Discharge: Family Type of Home: House Home Access: Stairs to enter Entrance Stairs-Rails: None Technical brewer of Steps: 1 Home Layout: One level        Prior Function Level of Independence: Independent               Hand Dominance   Dominant Hand: Right    Extremity/Trunk Assessment   Upper Extremity Assessment Upper Extremity Assessment: Overall WFL for tasks assessed    Lower Extremity Assessment Lower Extremity Assessment: Overall WFL for tasks assessed    Cervical / Trunk Assessment Cervical / Trunk Assessment: (s/p spinal surgery)  Communication   Communication: No difficulties  Cognition Arousal/Alertness: Awake/alert Behavior During Therapy: WFL for tasks assessed/performed Overall Cognitive Status: Within Functional Limits for tasks assessed                                        General Comments General comments (skin integrity, edema, etc.): educated on mobility expectations, car transfer and overall safety with mobility    Exercises     Assessment/Plan    PT Assessment Patent does not need any further PT services  PT  Problem List         PT Treatment Interventions      PT Goals (Current goals can be found in the Care Plan section)  Acute Rehab PT Goals PT Goal Formulation: All assessment and education complete, DC therapy    Frequency     Barriers to discharge        Co-evaluation               AM-PAC PT "6 Clicks" Daily Activity  Outcome Measure  Difficulty turning over in bed (including adjusting bedclothes, sheets and blankets)?: A Little Difficulty moving from lying on back to sitting on the side of the bed? : A Little Difficulty sitting down on and standing up from a chair with arms (e.g., wheelchair, bedside commode, etc,.)?: A Little Help needed moving to and from a bed to chair (including a wheelchair)?: A Little Help needed walking in hospital room?: A Little Help needed climbing 3-5 steps with a railing? : A Little 6 Click Score: 18    End of Session Equipment Utilized During Treatment: Back brace Activity Tolerance: Patient tolerated treatment well Patient left: in bed;with call bell/phone within reach;with family/visitor present Nurse Communication: Mobility status PT Visit Diagnosis: Difficulty in walking, not elsewhere classified (R26.2)    Time: 0223-3612 PT Time Calculation (min) (ACUTE ONLY): 18 min   Charges:   PT Evaluation $PT Eval Low Complexity: 1 Low     PT G Codes:        Alben Deeds, PT DPT  Board Certified Neurologic Specialist [place] 09/27/2017, 3:45 PM

## 2017-09-27 NOTE — Transfer of Care (Signed)
Immediate Anesthesia Transfer of Care Note  Patient: Phyllis Bell  Procedure(s) Performed: Lumbar Four-Five Maximum access posterior lumbar interbody fusion with resection of synovial cyst (N/A Back)  Patient Location: PACU  Anesthesia Type:General  Level of Consciousness: awake and patient cooperative  Airway & Oxygen Therapy: Patient Spontanous Breathing  Post-op Assessment: Report given to RN and Post -op Vital signs reviewed and stable  Post vital signs: Reviewed and stable  Last Vitals:  Vitals:   09/27/17 0545  BP: (!) 149/68  Pulse: 61  Resp: 17  Temp: 36.4 C  SpO2: 95%    Last Pain:  Vitals:   09/27/17 0550  TempSrc:   PainSc: 4          Complications: No apparent anesthesia complications

## 2017-09-27 NOTE — Brief Op Note (Signed)
09/27/2017  10:53 AM  PATIENT:  Phyllis Bell  72 y.o. 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  PROCEDURE:  Procedure(s) with comments: Lumbar Four-Five Maximum access posterior lumbar interbody fusion with resection of synovial cyst (N/A) - Lumbar Four-Five Maximum access posterior lumbar interbody fusion with resection of synovial cyst with pedicle screw fixation and posterolateral arthrodesis  SURGEON:  Surgeon(s) and Role:    Erline Levine, MD - Primary  PHYSICIAN ASSISTANT:   ASSISTANTS: Poteat, RN   ANESTHESIA:   general  EBL:  300 mL   BLOOD ADMINISTERED:none  DRAINS: none   LOCAL MEDICATIONS USED:  MARCAINE    and LIDOCAINE   SPECIMEN:  No Specimen  DISPOSITION OF SPECIMEN:  N/A  COUNTS:  YES  TOURNIQUET:  * No tourniquets in log *  DICTATION: DICTATION: Patient is a 72 year old with spondylosis , stenosis, spondylolisthesis, 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 It was elected to take her to surgery for MASPLIF L 45 level with posterolateral arthrodesis.  Procedure:   Following uncomplicated induction of GETA, and placement of electrodes for neural monitoring, patient was turned into a prone position on the [employer] tableand using AP  fluoroscopy the area of planned incision was marked, prepped with betadine scrub and Duraprep, then draped. Exposure was performed of facet joint complex at L 45 level and the MAS retractor was placed.5.5 x 35 mm cortical Nuvasive screws were placed at L 4 bilaterally according to standard landmarks using neural monitoring.  A total laminectomy of L 4 was then performed with  disarticulation of facets.  The large synovial cyst was dissected from the paraspinous soft tissues and removed. It originated in the right L 45 facet joint.    This bone was saved for grafting, combined with allograft after being run through bone mill and was placed in bone packing device.  Thorough discectomy was performed bilaterally at L 45 and the endplates were prepared for grafting.  23 x 10 x 8 degree cages were placed in the interspace and positioning was confirmed with AP and lateral fluoroscopy.  10 cc of autograft was packed in the interspace medial to the second cage.   Remaining screws were placed at L 5 (5.5 x 35) and 35 mm rods were placed.   And the screws were locked and torqued.Final Xrays showed well positioned implants and screw fixation. The posterolateral region was packed with remaining 20 cc of autograft mixed with allograft on each side of midline. The wounds were irrigated and then closed with 1, 2-0 and 3-0 Vicryl stitches. Sterile occlusive dressing was placed with Dermabond. The patient was then extubated in the operating room and taken to recovery in stable and satisfactory condition having tolerated her operation well. Counts were correct at the end of the case.  PLAN OF CARE: Admit to inpatient   PATIENT DISPOSITION:  PACU - hemodynamically stable.   Delay start of Pharmacological VTE agent (>24hrs) due to surgical blood loss or risk of bleeding: yes

## 2017-09-27 NOTE — Anesthesia Postprocedure Evaluation (Signed)
Anesthesia Post Note  Patient: Phyllis Bell  Procedure(s) Performed: Lumbar Four-Five Maximum access posterior lumbar interbody fusion with resection of synovial cyst (N/A Back)     Patient location during evaluation: PACU Anesthesia Type: General Level of consciousness: awake and alert Pain management: pain level controlled Vital Signs Assessment: post-procedure vital signs reviewed and stable Respiratory status: spontaneous breathing, nonlabored ventilation, respiratory function stable and patient connected to nasal cannula oxygen Cardiovascular status: blood pressure returned to baseline and stable Postop Assessment: no apparent nausea or vomiting Anesthetic complications: no    Last Vitals:  Vitals:   09/27/17 1240 09/27/17 1555  BP: 134/63 (!) 107/49  Pulse: 83 86  Resp: 16 16  Temp: 36.4 C 36.5 C  SpO2: 100% 97%    Last Pain:  Vitals:   09/27/17 1741  TempSrc:   PainSc: 7                  Wilmont Olund DAVID

## 2017-09-27 NOTE — Anesthesia Procedure Notes (Signed)
Procedure Name: Intubation Date/Time: 09/27/2017 7:43 AM Performed by: Lance Coon, CRNA Pre-anesthesia Checklist: Patient identified, Emergency Drugs available, Suction available, Patient being monitored and Timeout performed Patient Re-evaluated:Patient Re-evaluated prior to induction Oxygen Delivery Method: Circle system utilized Preoxygenation: Pre-oxygenation with 100% oxygen Induction Type: IV induction Ventilation: Mask ventilation without difficulty Laryngoscope Size: Miller and 3 Grade View: Grade II Tube type: Oral Tube size: 7.5 mm Number of attempts: 1 Airway Equipment and Method: Stylet Placement Confirmation: ETT inserted through vocal cords under direct vision,  positive ETCO2 and breath sounds checked- equal and bilateral Secured at: 22 cm Tube secured with: Tape Dental Injury: Teeth and Oropharynx as per pre-operative assessment

## 2017-09-27 NOTE — Op Note (Signed)
09/27/2017  10:53 AM  PATIENT:  Phyllis Bell  72 y.o. 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  PROCEDURE:  Procedure(s) with comments: Lumbar Four-Five Maximum access posterior lumbar interbody fusion with resection of synovial cyst (N/A) - Lumbar Four-Five Maximum access posterior lumbar interbody fusion with resection of synovial cyst with pedicle screw fixation and posterolateral arthrodesis  SURGEON:  Surgeon(s) and Role:    Erline Levine, MD - Primary  PHYSICIAN ASSISTANT:   ASSISTANTS: Poteat, RN   ANESTHESIA:   general  EBL:  300 mL   BLOOD ADMINISTERED:none  DRAINS: none   LOCAL MEDICATIONS USED:  MARCAINE    and LIDOCAINE   SPECIMEN:  No Specimen  DISPOSITION OF SPECIMEN:  N/A  COUNTS:  YES  TOURNIQUET:  * No tourniquets in log *  DICTATION: DICTATION: Patient is a 72 year old with spondylosis , stenosis, spondylolisthesis, 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 It was elected to take her to surgery for MASPLIF L 45 level with posterolateral arthrodesis.  Procedure:   Following uncomplicated induction of GETA, and placement of electrodes for neural monitoring, patient was turned into a prone position on the [place] tableand using AP  fluoroscopy the area of planned incision was marked, prepped with betadine scrub and Duraprep, then draped. Exposure was performed of facet joint complex at L 45 level and the MAS retractor was placed.5.5 x 35 mm cortical Nuvasive screws were placed at L 4 bilaterally according to standard landmarks using neural monitoring.  A total laminectomy of L 4 was then performed with  disarticulation of facets.  The large synovial cyst was dissected from the paraspinous soft tissues and removed. It originated in the right L 45 facet joint.    This bone was saved for grafting, combined with allograft after being run through bone mill and was placed in bone packing device.  Thorough discectomy was performed bilaterally at L 45 and the endplates were prepared for grafting.  23 x 10 x 8 degree cages were placed in the interspace and positioning was confirmed with AP and lateral fluoroscopy.  10 cc of autograft was packed in the interspace medial to the second cage.   Remaining screws were placed at L 5 (5.5 x 35) and 35 mm rods were placed.   And the screws were locked and torqued.Final Xrays showed well positioned implants and screw fixation. The posterolateral region was packed with remaining 20 cc of autograft mixed with allograft on each side of midline. The wounds were irrigated and then closed with 1, 2-0 and 3-0 Vicryl stitches. Sterile occlusive dressing was placed with Dermabond. The patient was then extubated in the operating room and taken to recovery in stable and satisfactory condition having tolerated her operation well. Counts were correct at the end of the case.  PLAN OF CARE: Admit to inpatient   PATIENT DISPOSITION:  PACU - hemodynamically stable.   Delay start of Pharmacological VTE agent (>24hrs) due to surgical blood loss or risk of bleeding: yes

## 2017-09-28 ENCOUNTER — Encounter (HOSPITAL_COMMUNITY): Payer: Self-pay | Admitting: Neurosurgery

## 2017-09-28 LAB — GLUCOSE, CAPILLARY: Glucose-Capillary: 137 mg/dL — ABNORMAL HIGH (ref 65–99)

## 2017-09-28 MED ORDER — HYDROCODONE-ACETAMINOPHEN 5-325 MG PO TABS: 1.0000 | ORAL_TABLET | ORAL | 0 refills | Status: DC | PRN

## 2017-09-28 MED ORDER — METHOCARBAMOL 500 MG PO TABS: 500.0000 mg | ORAL_TABLET | Freq: Four times a day (QID) | ORAL | 1 refills | Status: DC | PRN

## 2017-09-28 MED FILL — Thrombin For Soln 20000 Unit: CUTANEOUS | Qty: 1 | Status: AC

## 2017-09-28 MED FILL — Thrombin For Soln 5000 Unit: CUTANEOUS | Qty: 5000 | Status: AC

## 2017-09-28 NOTE — Evaluation (Signed)
Occupational Therapy Evaluation and Discharge Patient Details Name: Phyllis Bell MRN: [id number] DOB: 1946-05-23 Today's Date: 09/28/2017    History of Present Illness Pt is a 72 y.o. female s/p Resection of synovial cyst with L4-5 MAS PLIF. No pertinent PMHx in chart.    Clinical Impression   Pt reports she was independent with ADL PTA. Currently pt supervision with ADL and functional mobility. All back, safety, and ADL education completed with pt and daughter. Pt planning to d/c home with 24/7 supervision from family. No further acute OT needs identified; signing off at this time. Please re-consult if needs change. Thank you for this referral.    Follow Up Recommendations  No OT follow up;Supervision - Intermittent    Equipment Recommendations  None recommended by OT    Recommendations for Other Services       Precautions / Restrictions Precautions Precautions: Back Precaution Booklet Issued: No Precaution Comments: Pt able to recall 3/3 back precautions at start of session. Required Braces or Orthoses: Spinal Brace Spinal Brace: Lumbar corset;Applied in sitting position Restrictions Weight Bearing Restrictions: No      Mobility Bed Mobility               General bed mobility comments: Pt sitting EOB upon arrival  Transfers Overall transfer level: Needs assistance Equipment used: None Transfers: Sit to/from Stand Sit to Stand: Supervision         General transfer comment: for safety, no physical assist required    Balance Overall balance assessment: No apparent balance deficits (not formally assessed)                                         ADL either performed or assessed with clinical judgement   ADL Overall ADL's : Needs assistance/impaired Eating/Feeding: Independent;Sitting   Grooming: Supervision/safety;Standing Grooming Details (indicate cue type and reason): Educated on use of 2 cups for oral care Upper Body Bathing: Set  up;Sitting   Lower Body Bathing: Minimal assistance;Sit to/from stand   Upper Body Dressing : Set up;Sitting Upper Body Dressing Details (indicate cue type and reason): for shirt and brace Lower Body Dressing: Minimal assistance;Sit to/from stand Lower Body Dressing Details (indicate cue type and reason): to start clothing over feet, husband to assist upon return home Toilet Transfer: Supervision/safety;Ambulation;BSC Toilet Transfer Details (indicate cue type and reason): Pt reports husband already set [facility] up over toilet at home   Toileting - Clothing Manipulation Details (indicate cue type and reason): Educated on proper technique for peri care without twisting. Tub/ Shower Transfer: Supervision/safety;Walk-in shower;Ambulation;Shower Scientist, research (medical) Details (indicate cue type and reason): Educated on use of shower chair for safety with bathing initially Functional mobility during ADLs: Supervision/safety General ADL Comments: Educated pt on maintaining back precautions during functional tasks, keeping frequently used items at counter top height, and frequent mobility throughout the day upon return home.     Vision         Perception     Praxis      Pertinent Vitals/Pain Pain Assessment: 0-10 Pain Score: 4  Pain Location: back Pain Descriptors / Indicators: Sore Pain Intervention(s): Monitored during session     Hand Dominance Right   Extremity/Trunk Assessment Upper Extremity Assessment Upper Extremity Assessment: Overall WFL for tasks assessed   Lower Extremity Assessment Lower Extremity Assessment: Defer to PT evaluation   Cervical / Trunk Assessment Cervical / Trunk Assessment:  Other exceptions Cervical / Trunk Exceptions: s/p MAS PLIF   Communication Communication Communication: No difficulties   Cognition Arousal/Alertness: Awake/alert Behavior During Therapy: WFL for tasks assessed/performed Overall Cognitive Status: Within Functional Limits for  tasks assessed                                     General Comments       Exercises     Shoulder Instructions      Home Living Family/patient expects to be discharged to:: Private residence Living Arrangements: Spouse/significant other Available Help at Discharge: Family;Available 24 hours/day Type of Home: House Home Access: Stairs to enter [employer] of Steps: 1 Entrance Stairs-Rails: None Home Layout: One level     Bathroom Shower/Tub: Occupational psychologist: Handicapped height     Home Equipment: Bedside commode;Shower seat          Prior Functioning/Environment Level of Independence: Independent                 OT Problem List:        OT Treatment/Interventions:      OT Goals(Current goals can be found in the care plan section) Acute Rehab OT Goals Patient Stated Goal: return home today OT Goal Formulation: All assessment and education complete, DC therapy  OT Frequency:     Barriers to D/C:            Co-evaluation              AM-PAC PT "6 Clicks" Daily Activity     Outcome Measure Help from another person eating meals?: None Help from another person taking care of personal grooming?: A Little Help from another person toileting, which includes using toliet, bedpan, or urinal?: A Little Help from another person bathing (including washing, rinsing, drying)?: A Little Help from another person to put on and taking off regular upper body clothing?: None Help from another person to put on and taking off regular lower body clothing?: A Little 6 Click Score: 20   End of Session Equipment Utilized During Treatment: Back brace Nurse Communication: Mobility status;Other (comment)(no equipment or f/u needs)  Activity Tolerance: Patient tolerated treatment well Patient left: with call bell/phone within reach;with family/visitor present;Other (comment)(sitting EOB)  OT Visit Diagnosis: Pain Pain - part of  body: (back)                Time: 3785-8850 OT Time Calculation (min): 20 min Charges:  OT General Charges $OT Visit: 1 Visit OT Evaluation $OT Eval Low Complexity: 1 Low G-Codes:     Belford Pascucci A. Ulice Brilliant, M.S., OTR/L Pager: [place] 09/28/2017, 9:00 AM

## 2017-09-28 NOTE — Discharge Summary (Signed)
Physician Discharge Summary  Patient ID: Phyllis Bell MRN: [id number] DOB/AGE: 09/29/1945 72 y.o.  Admit date: 09/27/2017 Discharge date: 09/28/2017  Admission Diagnoses:Synovial cyst with spondylolisthesis L 45 with lumbago, radiculopathy  Discharge Diagnoses: Synovial cyst with spondylolisthesis L 45 with lumbago, radiculopathy Active Problems:   Spondylolisthesis of lumbar region   Discharged Condition: good  Hospital Course: Patient underwent resection of synovial cyst with PLIF fusion at L 45 level.  She did well postoperatively and was discharged home on POD 1.  Consults: None  Significant Diagnostic Studies: None  Treatments: surgery: Patient underwent resection of synovial cyst with PLIF fusion at L 45 level  Discharge Exam: Blood pressure 122/70, pulse 71, temperature 98.1 F (36.7 C), resp. rate 16, SpO2 97 %. Neurologic: Alert and oriented X 3, normal strength and tone. Normal symmetric reflexes. Normal coordination and gait Wound:CDI  Disposition: Home  Discharge Instructions    Diet - low sodium heart healthy   Complete by:  As directed    Increase activity slowly   Complete by:  As directed      Allergies as of 09/28/2017   No Known Allergies     Medication List    STOP taking these medications   meloxicam 15 MG tablet Commonly known as:  MOBIC     TAKE these medications   aspirin 81 MG tablet Take 81 mg by mouth at bedtime.   HYDROcodone-acetaminophen 5-325 MG tablet Commonly known as:  NORCO/VICODIN Take 1-2 tablets by mouth every 4 (four) hours as needed for severe pain ((score 7 to 10)).   losartan-hydrochlorothiazide 50-12.5 MG tablet Commonly known as:  HYZAAR Take 1 tablet by mouth daily.   metFORMIN 500 MG tablet Commonly known as:  GLUCOPHAGE Take 1 tablet by mouth 2 (two) times daily.   methocarbamol 500 MG tablet Commonly known as:  ROBAXIN Take 1 tablet (500 mg total) by mouth every 6 (six) hours as needed for muscle  spasms.   OMEGA-3 FISH OIL PO Take 2 capsules by mouth daily.   omeprazole 40 MG capsule Commonly known as:  PRILOSEC Take 40 mg by mouth daily.   simvastatin 40 MG tablet Commonly known as:  ZOCOR Take 40 mg by mouth daily.        Signed: Peggyann Shoals, MD 09/28/2017, 8:22 AM

## 2017-09-28 NOTE — Progress Notes (Signed)
Subjective: Patient reports doing well  Objective: Vital signs in last 24 hours: Temp:  [97.4 F (36.3 C)-98.1 F (36.7 C)] 98.1 F (36.7 C) (02/06 0745) Pulse Rate:  [67-86] 71 (02/06 0745) Resp:  [11-22] 16 (02/06 0745) BP: (107-134)/(49-93) 122/70 (02/06 0745) SpO2:  [93 %-100 %] 97 % (02/06 0745)  Intake/Output from previous day: 02/05 0701 - 02/06 0700 In: 2470 [P.O.:720; I.V.:1500; IV Piggyback:250] Out: 750 [Urine:450; Blood:300] Intake/Output this shift: No intake/output data recorded.  Physical Exam: Strength full.  Dressing CDI.  Lab Results: No results for input(s): WBC, HGB, HCT, PLT in the last 72 hours. BMET No results for input(s): NA, K, CL, CO2, GLUCOSE, BUN, CREATININE, CALCIUM in the last 72 hours.  Studies/Results: Dg Lumbar Spine 2-3 Views  Result Date: 09/27/2017 CLINICAL DATA:  L4-5 posterior fusion EXAM: DG C-ARM 61-120 MIN; LUMBAR SPINE - 2-3 VIEW COMPARISON:  07/11/2017 FINDINGS: Placement of posterior pedicle screws at L4 and L5. Disc spacer noted at L4-5. No hardware or bony complicating feature. IMPRESSION: Posterior fusion L4-5. No hardware  that is no complicating feature. Electronically Signed   By: Rolm Baptise M.D.   On: 09/27/2017 10:07   Dg C-arm 1-60 Min  Result Date: 09/27/2017 CLINICAL DATA:  L4-5 posterior fusion EXAM: DG C-ARM 61-120 MIN; LUMBAR SPINE - 2-3 VIEW COMPARISON:  07/11/2017 FINDINGS: Placement of posterior pedicle screws at L4 and L5. Disc spacer noted at L4-5. No hardware or bony complicating feature. IMPRESSION: Posterior fusion L4-5. No hardware  that is no complicating feature. Electronically Signed   By: Rolm Baptise M.D.   On: 09/27/2017 10:07    Assessment/Plan: Doing well.  Discharge home.    LOS: 1 day    Peggyann Shoals, MD 09/28/2017, 8:21 AM

## 2017-09-28 NOTE — Progress Notes (Signed)
Patient alert and oriented, mae's well, voiding adequate amount of urine, swallowing without difficulty, no c/o pain at time of discharge. Patient discharged home with family. Script and discharged instructions given to patient. Patient and family stated understanding of instructions given. Patient has an appointment with Dr.Stern    

## 2017-09-28 NOTE — Discharge Instructions (Signed)

## 2019-02-12 ENCOUNTER — Other Ambulatory Visit: Payer: Self-pay | Admitting: Neurosurgery

## 2019-02-12 DIAGNOSIS — M5416 Radiculopathy, lumbar region: Secondary | ICD-10-CM

## 2019-03-08 ENCOUNTER — Other Ambulatory Visit: Payer: Self-pay

## 2019-03-08 ENCOUNTER — Ambulatory Visit
Admission: RE | Admit: 2019-03-08 | Discharge: 2019-03-08 | Disposition: A | Discharge status: Home / Self Care | Payer: Medicare Other | Source: Ambulatory Visit | Attending: Neurosurgery | Admitting: Neurosurgery

## 2019-03-08 DIAGNOSIS — M5416 Radiculopathy, lumbar region: Secondary | ICD-10-CM

## 2019-03-08 MED ORDER — GADOBENATE DIMEGLUMINE 529 MG/ML IV SOLN
20.0000 mL | Freq: Once | INTRAVENOUS | Status: AC | PRN
  Administered 2019-03-08: 20 mL via INTRAVENOUS

## 2020-11-17 ENCOUNTER — Other Ambulatory Visit: Payer: Self-pay | Admitting: Neurosurgery

## 2020-11-17 DIAGNOSIS — M5416 Radiculopathy, lumbar region: Secondary | ICD-10-CM

## 2020-12-11 ENCOUNTER — Ambulatory Visit
Admission: RE | Admit: 2020-12-11 | Discharge: 2020-12-11 | Disposition: A | Discharge status: Home / Self Care | Payer: Medicare Other | Source: Ambulatory Visit | Attending: Neurosurgery | Admitting: Neurosurgery

## 2020-12-11 DIAGNOSIS — M5416 Radiculopathy, lumbar region: Secondary | ICD-10-CM

## 2020-12-11 MED ORDER — GADOBENATE DIMEGLUMINE 529 MG/ML IV SOLN
20.0000 mL | Freq: Once | INTRAVENOUS | Status: AC | PRN
  Administered 2020-12-11: 20 mL via INTRAVENOUS

## 2022-08-16 IMAGING — MR MR LUMBAR SPINE WO/W CM
4 of 7 series · 20 of 48 positions shown · IV contrast (multihance)
Comparison: MRI 03/08/2019

CLINICAL DATA: Chronic low back pain and weakness. History of prior
spine fusion

EXAM:
MRI LUMBAR SPINE WITHOUT AND WITH CONTRAST
TECHNIQUE: Multiplanar and multiecho pulse sequences of the lumbar spine were
obtained without and with intravenous contrast.
CONTRAST:  20mL MULTIHANCE GADOBENATE DIMEGLUMINE 529 MG/ML IV SOLN

[Series 5: T1 · sagittal · 4.0mm · 0.73mm/px · 4 of 19 slices shown (1 of 2)]
[im 1/19]
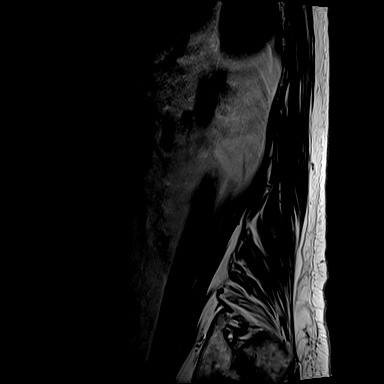
[im 7/19]
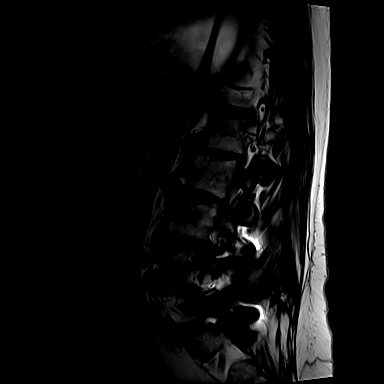
[im 13/19]
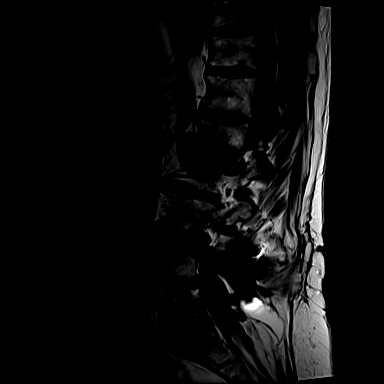
[im 19/19]
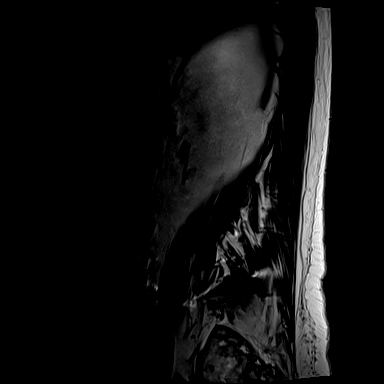

[Series 9: T1 · axial · 4.0mm · 0.28mm/px · z∈[-101,+55]mm · 3 of 40 slices shown (2 of 2)]
[im 5/40]
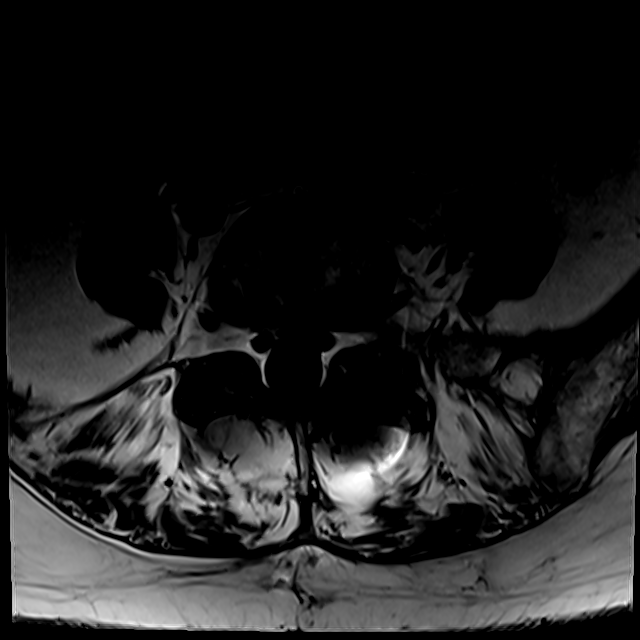
[im 22/40]
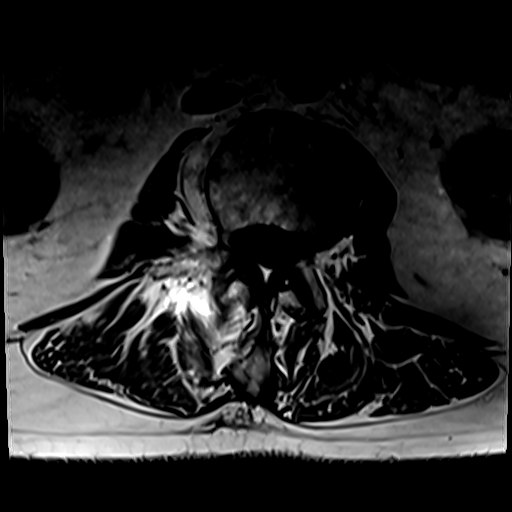
[im 35/40]
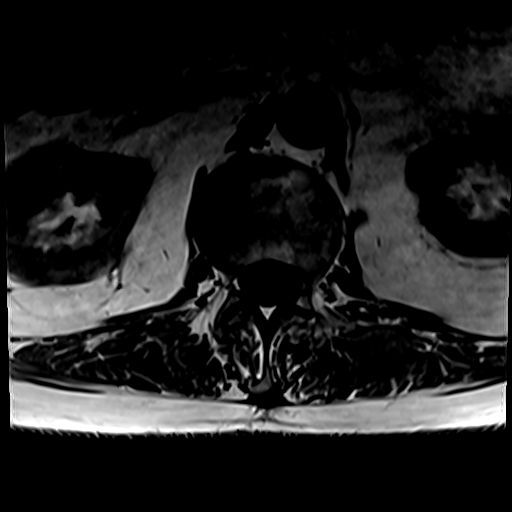

[Series 12: T2 · axial · 4.0mm · 0.28mm/px · z∈[-121,+80]mm · 8 of 40 slices shown (1 of 2)]
[im 1/40]
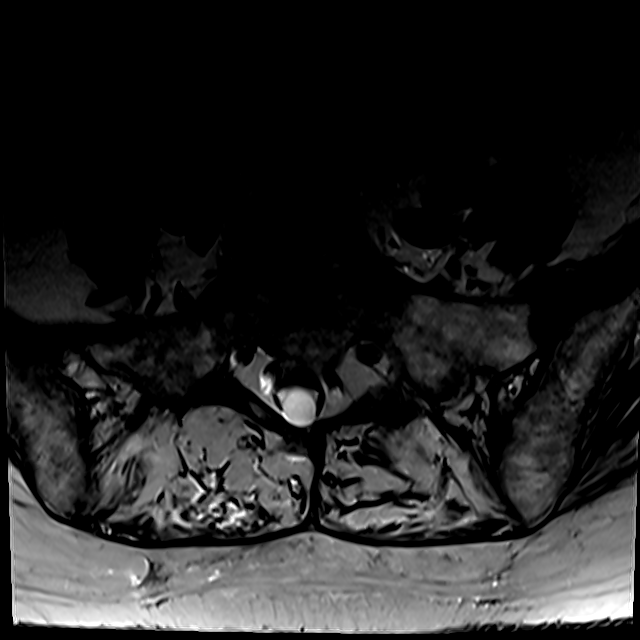
[im 5/40]
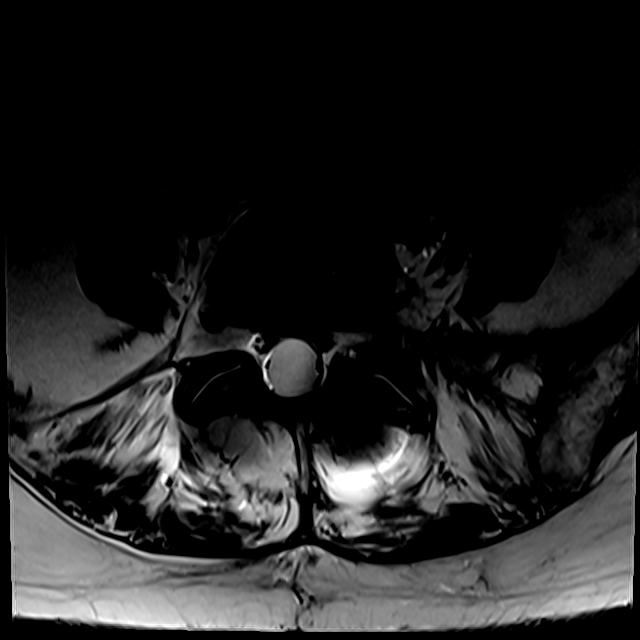
[im 14/40]
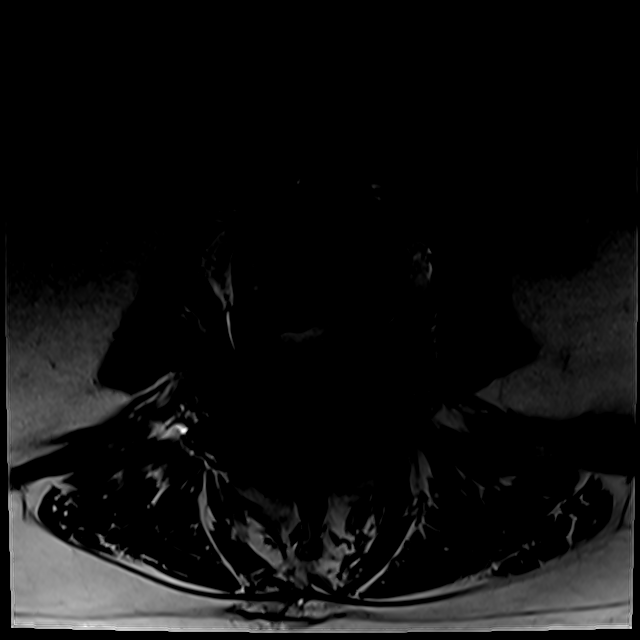
[im 18/40]
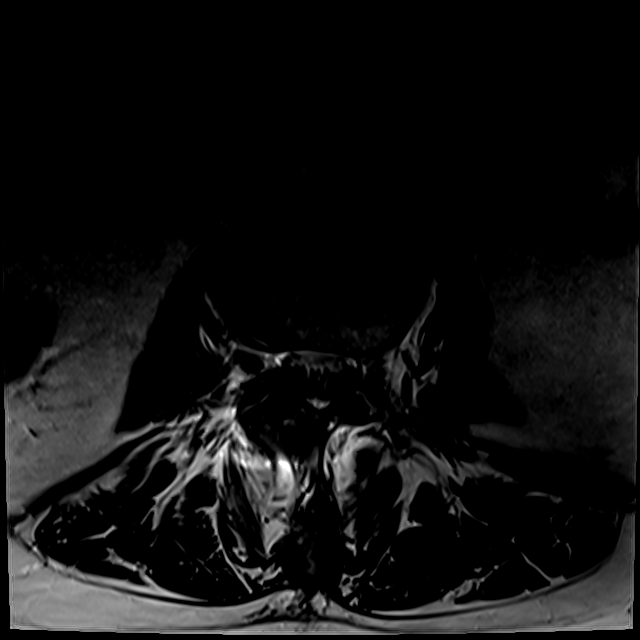
[im 22/40]
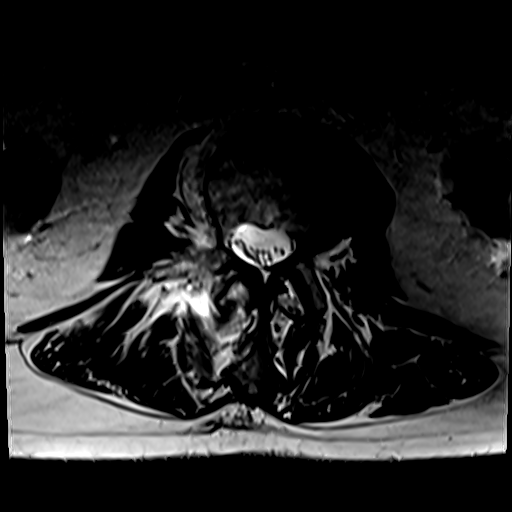
[im 27/40]
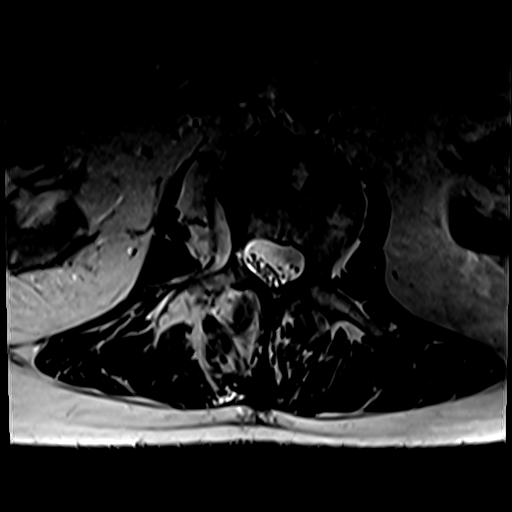
[im 35/40]
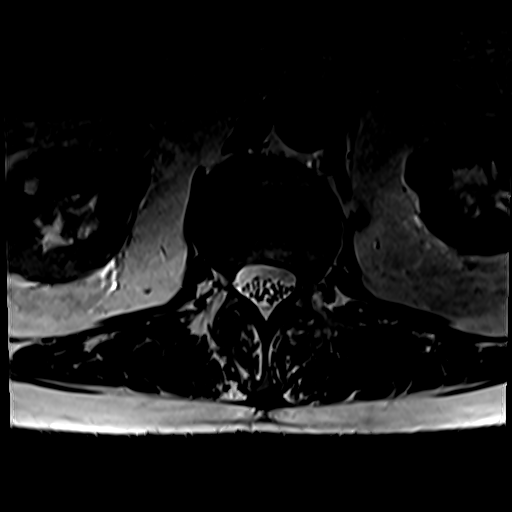
[im 40/40]
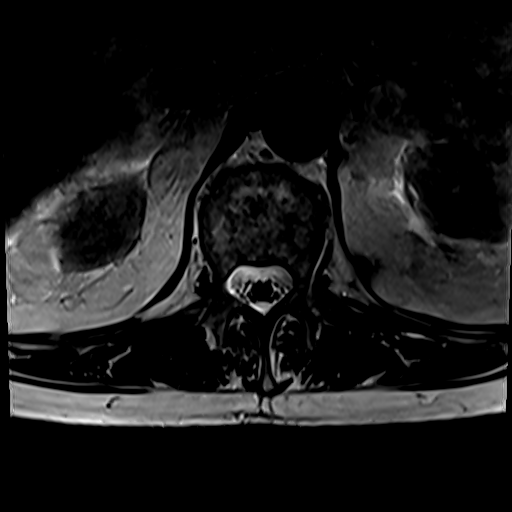

[Series 13: T2 · sagittal · 4.0mm · 0.73mm/px · 5 of 19 slices shown (2 of 2)]
[im 1/19]
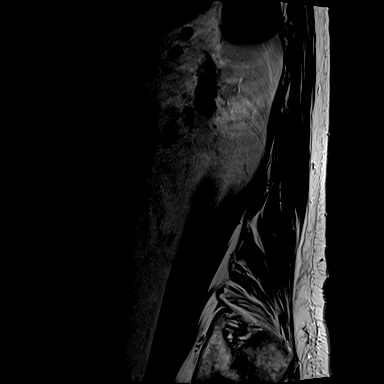
[im 5/19]
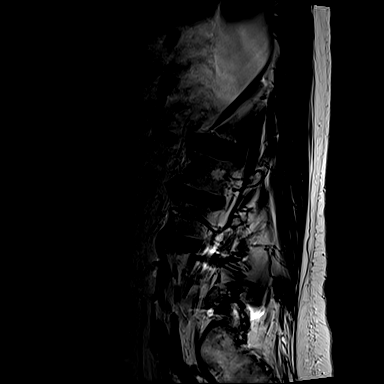
[im 10/19]
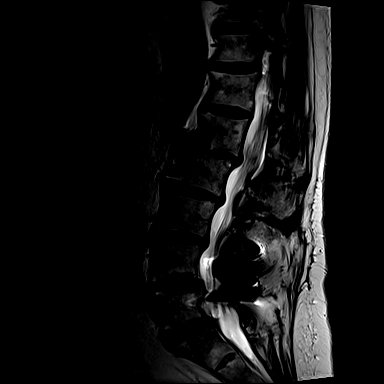
[im 14/19]
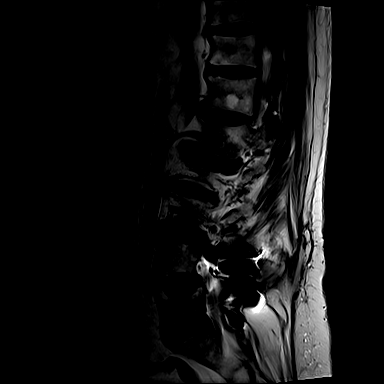
[im 19/19]
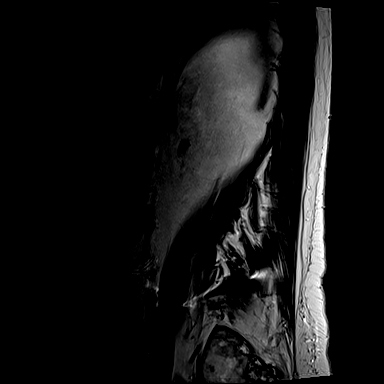

[20 of 48 positions shown; findings below may reference images not displayed]

FINDINGS: Segmentation: Numbering schema as designated on previous MRI with
lowest well developed disc space designated as L5-S1.

Alignment: 4 mm grade 1 anterolisthesis L4 on L5. Slight
retrolisthesis of L1 on L2. Lumbar levocurvature.

Vertebrae: L4-5 PLIF. No evidence of fracture. Progressive
discogenic endplate marrow changes at L1-2 and L3-4.

Conus medullaris and cauda equina: Conus extends to the T12-L1
level. Conus and cauda equina appear normal.

Paraspinal and other soft tissues: Negative.

Disc levels:

T12-L1: No significant disc protrusion, foraminal stenosis, or canal
stenosis.

L1-L2: Disc height loss with broad-based disc bulge. Mild bilateral
facet arthropathy. There is mild canal stenosis and mild right
foraminal stenosis. No progression from prior.

L2-L3: No disc height loss with broad-based disc bulge. Mild
bilateral facet arthropathy. There is impress upon the ventral
thecal sac without significant canal stenosis. No foraminal
stenosis. Unchanged.

L3-L4: Broad-based disc bulge with right foraminal/extraforaminal
protrusion. Bilateral facet arthropathy. No foraminal stenosis. Mild
canal stenosis. Unchanged.

L4-L5: Prior fusion. No foraminal or canal stenosis. No abnormal
postcontrast enhancement. Unchanged.

L5-S1: Shallow central disc protrusion. Mild bilateral facet
arthropathy. No foraminal or canal stenosis. Unchanged.
IMPRESSION: 1. Prior L4-5 PLIF. No foraminal or canal stenosis at this level.
2. Mild canal stenosis at L1-2 and L3-4, without significant
progression.
3. Mild right-sided foraminal stenosis at L1-2.

## 2023-01-22 HISTORY — PX: BACK SURGERY: SHX140

## 2024-04-05 NOTE — H&P (Signed)
 TOTAL KNEE ADMISSION H&P  Patient is being admitted for right total knee arthroplasty.  Subjective:  Chief Complaint: Right knee pain.  HPI: Phyllis Bell, 78 y.o. female has a history of pain and functional disability in the right knee due to arthritis and has failed non-surgical conservative treatments for greater than 12 weeks to include corticosteriod injections, viscosupplementation injections, and activity modification. Onset of symptoms was gradual, starting several years ago with gradually worsening course since that time. The patient noted no past surgery on the right knee.  Patient currently rates pain in the right knee at 7 out of 10 with activity. Patient has worsening of pain with activity and weight bearing and pain that interferes with activities of daily living. Patient has evidence of periarticular osteophytes and joint space narrowing by imaging studies. There is no active infection.  Patient Active Problem List   Diagnosis Date Noted   Spondylolisthesis of lumbar region 09/27/2017   Special screening for malignant neoplasms, colon     Past Medical History:  Diagnosis Date   Arthritis    Cancer (HCC) 2006   Uterine cancer   Diabetes mellitus without complication (HCC)    Type II   GERD (gastroesophageal reflux disease)    Hypercholesteremia    Hypertension    Sleep apnea    Spondylolisthesis of lumbar region     Past Surgical History:  Procedure Laterality Date   ABDOMINAL HYSTERECTOMY     APPENDECTOMY     COLONOSCOPY     COLONOSCOPY N/A 02/18/2016   Procedure: COLONOSCOPY;  Surgeon: Margo LITTIE Haddock, MD;  Location: [facility];  Service: Endoscopy;  Laterality: N/A;  100   EYE SURGERY     Left Cataract   Incisional hernia repair with mesh     MAXIMUM ACCESS (MAS)POSTERIOR LUMBAR INTERBODY FUSION (PLIF) 1 LEVEL N/A 09/27/2017   Procedure: Lumbar Four-Five Maximum access posterior lumbar interbody fusion with resection of synovial cyst;  Surgeon: Unice Pac, MD;  Location: [facility] OR;  Service: Neurosurgery;  Laterality: N/A;  Lumbar Four-Five Maximum access posterior lumbar interbody fusion with resection of synovial cyst   TONSILLECTOMY      Prior to Admission medications   Medication Sig Start Date End Date Taking? Authorizing Provider  aspirin  81 MG tablet Take 81 mg by mouth at bedtime.     [provider]  HYDROcodone -acetaminophen  (NORCO/VICODIN) 5-325 MG tablet Take 1-2 tablets by mouth every 4 (four) hours as needed for severe pain ((score 7 to 10)). 09/28/17   Unice Pac, MD  losartan -hydrochlorothiazide  (HYZAAR) 50-12.5 MG tablet Take 1 tablet by mouth daily.    [provider]  metFORMIN  (GLUCOPHAGE ) 500 MG tablet Take 1 tablet by mouth 2 (two) times daily. 07/22/17   [provider]  methocarbamol  (ROBAXIN ) 500 MG tablet Take 1 tablet (500 mg total) by mouth every 6 (six) hours as needed for muscle spasms. 09/28/17   Unice Pac, MD  Omega-3 Fatty Acids (OMEGA-3 FISH OIL PO) Take 2 capsules by mouth daily.     [provider]  omeprazole (PRILOSEC) 40 MG capsule Take 40 mg by mouth daily.    [provider]  simvastatin  (ZOCOR ) 40 MG tablet Take 40 mg by mouth daily.    [provider]    No Known Allergies  Social History   Socioeconomic History   Marital status: Married    Spouse name: Not on file   Number of children: Not on file   Years of education: Not  on file   Highest education level: Not on file  Occupational History   Not on file  Tobacco Use   Smoking status: Former    Current packs/day: 1.00    Average packs/day: 1 pack/day for 20.0 years (20.0 ttl pk-yrs)    Types: Cigarettes   Smokeless tobacco: Never  Vaping Use   Vaping status: Never Used  Substance and Sexual Activity   Alcohol use: No   Drug use: No   Sexual activity: Not on file  Other Topics Concern   Not on file  Social History Narrative   Not on file   Social Drivers of Health    Financial Resource Strain: Not on file  Food Insecurity: Low Risk  (01/27/2023)   Received from [facility]   Hunger Vital Sign    Within the past 12 months, you worried that your food would run out before you got money to buy more: Never true    Within the past 12 months, the food you bought just didn't last and you didn't have money to get more. : Never true  Transportation Needs: No Transportation Needs (01/27/2023)   Received from [facility]    In the past 12 months, has lack of reliable transportation kept you from medical appointments, meetings, work or from getting things needed for daily living? : No  Physical Activity: Not on file  Stress: Not on file  Social Connections: Not on file  Intimate Partner Violence: Not on file    Tobacco Use: Medium Risk (02/03/2024)   Received from [facility]   Patient History    Smoking Tobacco Use: Former    Smokeless Tobacco Use: Never    Passive Exposure: Not on file   Social History   Substance and Sexual Activity  Alcohol Use No    No family history on file.  ROS  Objective:  Physical Exam: - General: Well-developed female, alert, oriented, and in no apparent distress.   - Right Hip: Normal motion with no discomfort.   - Right Knee: Moderate-sized effusion, range of motion 10-120, crepitus on range of motion, tenderness lateral greater than medial, no instability.   - Left Knee: No effusion, range of motion 10-120, moderate crepitus on range of motion, tenderness medial greater than lateral, no instability.   - Gait: Significant antalgic gait pattern with knees bent and crouching forward, utilizing a cane to walk.    IMAGING:    Radiographs of both knees, including AP and lateral views, demonstrate severe patellofemoral arthritis bilaterally, worse on the right than the left. The right knee shows erosion of approximately half the patella and a large groove in the anterior femur. There is lateral  joint space narrowing, almost bone-on-bone, in the right knee. The left knee demonstrates medial joint space narrowing, also almost bone-on-bone.  Assessment/Plan:  End stage arthritis, right knee   The patient history, physical examination, clinical judgment of the provider and imaging studies are consistent with end stage degenerative joint disease of the right knee and total knee arthroplasty is deemed medically necessary. The treatment options including medical management, injection therapy arthroscopy and arthroplasty were discussed at length. The risks and benefits of total knee arthroplasty were presented and reviewed. The risks due to aseptic loosening, infection, stiffness, patella tracking problems, thromboembolic complications and other imponderables were discussed. The patient acknowledged the explanation, agreed to proceed with the plan and consent was signed. Patient is being admitted for inpatient treatment for surgery, pain control, PT, OT, prophylactic  antibiotics, VTE prophylaxis, progressive ambulation and ADLs and discharge planning. The patient is planning to be discharged home.   Patient's anticipated LOS is less than 2 midnights, meeting these requirements: - Younger than 35 - Lives within 1 hour of care - Has a competent adult at home to recover with post-op recover - NO history of  - Chronic pain requiring opiods  - Coronary Artery Disease  - Heart failure  - Heart attack  - Stroke  - DVT/VTE  - Cardiac arrhythmia  - Respiratory Failure/COPD  - Renal failure  - Anemia  - Advanced Liver disease  Therapy Plans: [facility] Disposition: Home with husband and help of daughter who is a nurse  Planned DVT Prophylaxis: Xarelto 10 mg QD (hx uterine cancer)  DME Needed: Vannie ordered PCP: Dr Diedra, clearance pending* Cardiologist: Brian Zagol, MD (clearance received) TXA: IV Allergies: NKDA Anesthesia Concerns: None BMI: 27.7 Last HgbA1c: 6.1% per patient,  order recheck at PAT* Pharmacy: [facility], deliver to room  Other: -Pt will bring clearance form to PCP, appt in 2 weeks  - Patient was instructed on what medications to stop prior to surgery. - Follow-up visit in 2 weeks with Dr. Melodi - Begin physical therapy following surgery - Pre-operative lab work as pre-surgical testing - Prescriptions will be provided in hospital at time of discharge  Corean Sender, PA-C Orthopedic Surgery [facility] [place]

## 2024-04-18 NOTE — Patient Instructions (Addendum)
 SURGICAL WAITING ROOM VISITATION  Patients having surgery or a procedure may have no more than 2 support people in the waiting area - these visitors may rotate.    Children under the age of 28 must have an adult with them who is not the patient.  Visitors with respiratory illnesses are discouraged from visiting and should remain at home.  If the patient needs to stay at the hospital during part of their recovery, the visitor guidelines for inpatient rooms apply. Pre-op nurse will coordinate an appropriate time for 1 support person to accompany patient in pre-op.  This support person may not rotate.    Please refer to the [facility] website for the visitor guidelines for Inpatients (after your surgery is over and you are in a regular room).       Your procedure is scheduled on: 04-30-24    Report to [facility] Main Entrance    Report to admitting at       0515  AM   Call this number if you have problems the morning of surgery [phone]   Do not eat food :After Midnight.    After Midnight you may have the following liquids until _0415 _____ AM/  DAY OF SURGERY    then nothing by mouth  Water  Non-Citrus Juices (without pulp, NO RED-Apple, White grape, White cranberry) Black Coffee (NO MILK/CREAM OR CREAMERS, sugar ok)  Clear Tea (NO MILK/CREAM OR CREAMERS, sugar ok) regular and decaf                             Plain Jell-O (NO RED)                                           Fruit ices (not with fruit pulp, NO RED)                                     Popsicles (NO RED)                                                               Sports drinks like Gatorade (NO RED)                    The day of surgery:  Drink ONE (1) Pre-Surgery  G2 BY  0415  AM the morning of surgery. Drink in one sitting. Do not sip.  This drink was given to you during your hospital  pre-op appointment visit. Nothing else to drink after completing the  Pre-Surgery G2.          If you have  questions, please contact your surgeon's office.   FOLLOW ANY ADDITIONAL PRE OP INSTRUCTIONS YOU RECEIVED FROM YOUR SURGEON'S OFFICE!!!     Oral Hygiene is also important to reduce your risk of infection.                                    Remember - BRUSH YOUR TEETH THE  MORNING OF SURGERY WITH YOUR REGULAR TOOTHPASTE  DENTURES WILL BE REMOVED PRIOR TO SURGERY PLEASE DO NOT APPLY Poly grip OR ADHESIVES!!!   Do NOT smoke after Midnight   Stop all vitamins and herbal supplements 7 days before surgery.   Take these medicines the morning of surgery with A SIP OF WATER : omeprazole, solifenacin, Cymbalta, gabapentin    Bring CPAP mask and tubing day of surgery.                              You may not have any metal on your body including hair pins, jewelry, and body piercing             Do not wear make-up, lotions, powders, perfumes/cologne, or deodorant  Do not wear nail polish including gel and S&S, artificial/acrylic nails, or any other type of covering on natural nails including finger and toenails. If you have artificial nails, gel coating, etc. that needs to be removed by a nail salon please have this removed prior to surgery or surgery may need to be canceled/ delayed if the surgeon/ anesthesia feels like they are unable to be safely monitored.   Do not shave  5 days prior to surgery.                Do not bring valuables to the hospital. [facility] IS NOT             RESPONSIBLE   FOR VALUABLES.   Contacts, glasses, dentures or bridgework may not be worn into surgery.   Bring small overnight bag day of surgery.   DO NOT BRING YOUR HOME MEDICATIONS TO THE HOSPITAL. PHARMACY WILL DISPENSE MEDICATIONS LISTED ON YOUR MEDICATION LIST TO YOU DURING YOUR ADMISSION IN THE HOSPITAL!    Patients discharged on the day of surgery will not be allowed to drive home.  Someone NEEDS to stay with you for the first 24 hours after anesthesia.   Special Instructions: Bring a copy of  your healthcare power of attorney and living will documents the day of surgery if you haven't scanned them before.              Please read over the following fact sheets you were given: IF YOU HAVE QUESTIONS ABOUT YOUR PRE-OP INSTRUCTIONS PLEASE CALL [phone].    If you test positive for Covid or have been in contact with anyone that has tested positive in the last 10 days please notify you surgeon.      Pre-operative 5 CHG Bath Instructions   You can play a key role in reducing the risk of infection after surgery. Your skin needs to be as free of germs as possible. You can reduce the number of germs on your skin by washing with CHG (chlorhexidine  gluconate) soap before surgery. CHG is an antiseptic soap that kills germs and continues to kill germs even after washing.   DO NOT use if you have an allergy to chlorhexidine /CHG or antibacterial soaps. If your skin becomes reddened or irritated, stop using the CHG and notify one of our RNs at [phone].   Please shower with the CHG soap starting 4 days before surgery using the following schedule:     Please keep in mind the following:  DO NOT shave, including legs and underarms, starting the day of your first shower.   You may shave your face at any point before/day of surgery.  Place clean sheets on your  bed the day you start using CHG soap. Use a clean washcloth (not used since being washed) for each shower. DO NOT sleep with pets once you start using the CHG.   CHG Shower Instructions:  If you choose to wash your hair and private area, wash first with your normal shampoo/soap.  After you use shampoo/soap, rinse your hair and body thoroughly to remove shampoo/soap residue.  Turn the water  OFF and apply about 3 tablespoons (45 ml) of CHG soap to a CLEAN washcloth.  Apply CHG soap ONLY FROM YOUR NECK DOWN TO YOUR TOES (washing for 3-5 minutes)  DO NOT use CHG soap on face, private areas, open wounds, or sores.  Pay special attention to  the area where your surgery is being performed.  If you are having back surgery, having someone wash your back for you may be helpful. Wait 2 minutes after CHG soap is applied, then you may rinse off the CHG soap.  Pat dry with a clean towel  Put on clean clothes/pajamas   If you choose to wear lotion, please use ONLY the CHG-compatible lotions on the back of this paper.     Additional instructions for the day of surgery: DO NOT APPLY any lotions, deodorants, cologne, or perfumes.   Put on clean/comfortable clothes.  Brush your teeth.  Ask your nurse before applying any prescription medications to the skin.      CHG Compatible Lotions   Aveeno Moisturizing lotion  Cetaphil Moisturizing Cream  Cetaphil Moisturizing Lotion  Clairol Herbal Essence Moisturizing Lotion, Dry Skin  Clairol Herbal Essence Moisturizing Lotion, Extra Dry Skin  Clairol Herbal Essence Moisturizing Lotion, Normal Skin  Curel Age Defying Therapeutic Moisturizing Lotion with Alpha Hydroxy  Curel Extreme Care Body Lotion  Curel Soothing Hands Moisturizing Hand Lotion  Curel Therapeutic Moisturizing Cream, Fragrance-Free  Curel Therapeutic Moisturizing Lotion, Fragrance-Free  Curel Therapeutic Moisturizing Lotion, Original Formula  Eucerin Daily Replenishing Lotion  Eucerin Dry Skin Therapy Plus Alpha Hydroxy Crme  Eucerin Dry Skin Therapy Plus Alpha Hydroxy Lotion  Eucerin Original Crme  Eucerin Original Lotion  Eucerin Plus Crme Eucerin Plus Lotion  Eucerin TriLipid Replenishing Lotion  Keri Anti-Bacterial Hand Lotion  Keri Deep Conditioning Original Lotion Dry Skin Formula Softly Scented  Keri Deep Conditioning Original Lotion, Fragrance Free Sensitive Skin Formula  Keri Lotion Fast Absorbing Fragrance Free Sensitive Skin Formula  Keri Lotion Fast Absorbing Softly Scented Dry Skin Formula  Keri Original Lotion  Keri Skin Renewal Lotion Keri Silky Smooth Lotion  Keri Silky Smooth Sensitive Skin  Lotion  Nivea Body Creamy Conditioning Oil  Nivea Body Extra Enriched Lotion  Nivea Body Original Lotion  Nivea Body Sheer Moisturizing Lotion Nivea Crme  Nivea Skin Firming Lotion  NutraDerm 30 Skin Lotion  NutraDerm Skin Lotion  NutraDerm Therapeutic Skin Cream  NutraDerm Therapeutic Skin Lotion  ProShield Protective Hand Cream   Incentive Spirometer  An incentive spirometer is a tool that can help keep your lungs clear and active. This tool measures how well you are filling your lungs with each breath. Taking long deep breaths may help reverse or decrease the chance of developing breathing (pulmonary) problems (especially infection) following: A long period of time when you are unable to move or be active. BEFORE THE PROCEDURE  If the spirometer includes an indicator to show your best effort, your nurse or respiratory therapist will set it to a desired goal. If possible, sit up straight or lean slightly forward. Try not to slouch. Hold the incentive  spirometer in an upright position. INSTRUCTIONS FOR USE  Sit on the edge of your bed if possible, or sit up as far as you can in bed or on a chair. Hold the incentive spirometer in an upright position. Breathe out normally. Place the mouthpiece in your mouth and seal your lips tightly around it. Breathe in slowly and as deeply as possible, raising the piston or the ball toward the top of the column. Hold your breath for 3-5 seconds or for as long as possible. Allow the piston or ball to fall to the bottom of the column. Remove the mouthpiece from your mouth and breathe out normally. Rest for a few seconds and repeat Steps 1 through 7 at least 10 times every 1-2 hours when you are awake. Take your time and take a few normal breaths between deep breaths. The spirometer may include an indicator to show your best effort. Use the indicator as a goal to work toward during each repetition. After each set of 10 deep breaths, practice coughing  to be sure your lungs are clear. If you have an incision (the cut made at the time of surgery), support your incision when coughing by placing a pillow or rolled up towels firmly against it. Once you are able to get out of bed, walk around indoors and cough well. You may stop using the incentive spirometer when instructed by your caregiver.  RISKS AND COMPLICATIONS Take your time so you do not get dizzy or light-headed. If you are in pain, you may need to take or ask for pain medication before doing incentive spirometry. It is harder to take a deep breath if you are having pain. AFTER USE Rest and breathe slowly and easily. It can be helpful to keep track of a log of your progress. Your caregiver can provide you with a simple table to help with this. If you are using the spirometer at home, follow these instructions: SEEK MEDICAL CARE IF:  You are having difficultly using the spirometer. You have trouble using the spirometer as often as instructed. Your pain medication is not giving enough relief while using the spirometer. You develop fever of 100.5 F (38.1 C) or higher. SEEK IMMEDIATE MEDICAL CARE IF:  You cough up bloody sputum that had not been present before. You develop fever of 102 F (38.9 C) or greater. You develop worsening pain at or near the incision site. MAKE SURE YOU:  Understand these instructions. Will watch your condition. Will get help right away if you are not doing well or get worse. Document Released: 12/20/2006 Document Revised: 11/01/2011 Document Reviewed: 02/20/2007 [facility] Patient Information 2014 [place], [place].   ________________________________________________________________________

## 2024-04-18 NOTE — Progress Notes (Addendum)
 PCP - Dr. Elia Bo Cardiologist - Dr. Brian Zagol  PPM/ICD -  Device Orders -  Rep Notified -   Chest x-ray -  EKG - Requested   Stress Test -  ECHO -  Cardiac Cath -  Labs- 04-17-24 cbc/diff, hgbA1c  media  Sleep Study -  CPAP -   Fasting Blood Sugar - 100's Checks Blood Sugar _not often___   Blood Thinner Instructions: Aspirin  Instructions:  ERAS Protcol - PRE-SURGERY  G2-    COVID vaccine -yes  Activity--Able to complete ADL's with no CP or SOB  limited due to back pain   Anesthesia review: DM2, HTN, OSA  Patient denies shortness of breath, fever, cough and chest pain at PAT appointment   All instructions explained to the patient, with a verbal understanding of the material. Patient agrees to go over the instructions while at home for a better understanding. Patient also instructed to self quarantine after being tested for COVID-19. The opportunity to ask questions was provided.

## 2024-04-24 ENCOUNTER — Other Ambulatory Visit: Payer: Self-pay

## 2024-04-24 ENCOUNTER — Encounter (HOSPITAL_COMMUNITY): Payer: Self-pay

## 2024-04-24 ENCOUNTER — Encounter (HOSPITAL_COMMUNITY)
Admission: RE | Admit: 2024-04-24 | Discharge: 2024-04-24 | Disposition: A | Discharge status: Home / Self Care | Source: Ambulatory Visit | Attending: Orthopedic Surgery | Admitting: Orthopedic Surgery

## 2024-04-24 VITALS — BP 101/69 | HR 87 | Temp 98.0°F | Resp 16 | Ht 69.5 in | Wt 181.0 lb

## 2024-04-24 DIAGNOSIS — Z01818 Encounter for other preprocedural examination: Secondary | ICD-10-CM | POA: Insufficient documentation

## 2024-04-24 DIAGNOSIS — E119 Type 2 diabetes mellitus without complications: Secondary | ICD-10-CM | POA: Diagnosis not present

## 2024-04-24 LAB — GLUCOSE, CAPILLARY: Glucose-Capillary: 121 mg/dL — ABNORMAL HIGH (ref 70–99)

## 2024-04-26 DIAGNOSIS — M4316 Spondylolisthesis, lumbar region: Secondary | ICD-10-CM | POA: Diagnosis not present

## 2024-04-26 DIAGNOSIS — Z87891 Personal history of nicotine dependence: Secondary | ICD-10-CM | POA: Diagnosis not present

## 2024-04-26 DIAGNOSIS — E119 Type 2 diabetes mellitus without complications: Secondary | ICD-10-CM | POA: Diagnosis not present

## 2024-04-26 DIAGNOSIS — Z7982 Long term (current) use of aspirin: Secondary | ICD-10-CM | POA: Diagnosis not present

## 2024-04-26 DIAGNOSIS — M1711 Unilateral primary osteoarthritis, right knee: Secondary | ICD-10-CM | POA: Diagnosis not present

## 2024-04-26 DIAGNOSIS — Z7901 Long term (current) use of anticoagulants: Secondary | ICD-10-CM | POA: Diagnosis not present

## 2024-04-26 DIAGNOSIS — Z79899 Other long term (current) drug therapy: Secondary | ICD-10-CM | POA: Diagnosis not present

## 2024-04-26 DIAGNOSIS — K219 Gastro-esophageal reflux disease without esophagitis: Secondary | ICD-10-CM | POA: Diagnosis not present

## 2024-04-26 DIAGNOSIS — G4730 Sleep apnea, unspecified: Secondary | ICD-10-CM | POA: Diagnosis not present

## 2024-04-26 DIAGNOSIS — E7800 Pure hypercholesterolemia, unspecified: Secondary | ICD-10-CM | POA: Diagnosis not present

## 2024-04-26 DIAGNOSIS — Z7984 Long term (current) use of oral hypoglycemic drugs: Secondary | ICD-10-CM | POA: Diagnosis not present

## 2024-04-26 DIAGNOSIS — M179 Osteoarthritis of knee, unspecified: Secondary | ICD-10-CM | POA: Diagnosis present

## 2024-04-26 DIAGNOSIS — I10 Essential (primary) hypertension: Secondary | ICD-10-CM | POA: Diagnosis not present

## 2024-04-26 LAB — SURGICAL PCR SCREEN
MRSA, PCR: NEGATIVE
Staphylococcus aureus: NEGATIVE

## 2024-04-29 NOTE — Anesthesia Preprocedure Evaluation (Signed)
 Anesthesia Evaluation  Patient identified by MRN, date of birth, ID band Patient awake    Reviewed: Allergy & Precautions, H&P , NPO status , Patient's Chart, lab work & pertinent test results  History of Anesthesia Complications Negative for: history of anesthetic complications  Airway Mallampati: II  TM Distance: >3 FB Neck ROM: Full    Dental no notable dental hx.    Pulmonary sleep apnea , former smoker   Pulmonary exam normal breath sounds clear to auscultation       Cardiovascular hypertension, (-) Past MI Normal cardiovascular exam Rhythm:Regular Rate:Normal     Neuro/Psych neg Seizures negative neurological ROS  negative psych ROS   GI/Hepatic Neg liver ROS,GERD  ,,  Endo/Other  diabetes, Type 2    Renal/GU negative Renal ROS  negative genitourinary   Musculoskeletal  (+) Arthritis , Osteoarthritis,    Abdominal   Peds negative pediatric ROS (+)  Hematology negative hematology ROS (+)   Anesthesia Other Findings   Reproductive/Obstetrics negative OB ROS                              Anesthesia Physical Anesthesia Plan  ASA: 2  Anesthesia Plan: Spinal   Post-op Pain Management: Tylenol  PO (pre-op)* and Regional block*   Induction:   PONV Risk Score and Plan: 2 and Treatment may vary due to age or medical condition  Airway Management Planned: Natural Airway  Additional Equipment:   Intra-op Plan:   Post-operative Plan:   Informed Consent: I have reviewed the patients History and Physical, chart, labs and discussed the procedure including the risks, benefits and alternatives for the proposed anesthesia with the patient or authorized representative who has indicated his/her understanding and acceptance.     Dental advisory given  Plan Discussed with: CRNA  Anesthesia Plan Comments:          Anesthesia Quick Evaluation

## 2024-04-30 ENCOUNTER — Ambulatory Visit (HOSPITAL_COMMUNITY): Payer: Self-pay

## 2024-04-30 ENCOUNTER — Encounter (HOSPITAL_COMMUNITY)
Admission: RE | Disposition: A | Discharge status: Home / Self Care | Payer: Self-pay | Source: Home / Self Care | Attending: Orthopedic Surgery

## 2024-04-30 ENCOUNTER — Encounter (HOSPITAL_COMMUNITY): Payer: Self-pay | Admitting: Orthopedic Surgery

## 2024-04-30 ENCOUNTER — Ambulatory Visit (HOSPITAL_BASED_OUTPATIENT_CLINIC_OR_DEPARTMENT_OTHER): Payer: Self-pay

## 2024-04-30 ENCOUNTER — Ambulatory Visit (HOSPITAL_COMMUNITY)
Admission: RE | Admit: 2024-04-30 | Discharge: 2024-05-01 | Disposition: A | Discharge status: Home / Self Care | Attending: Orthopedic Surgery | Admitting: Orthopedic Surgery

## 2024-04-30 ENCOUNTER — Telehealth (HOSPITAL_COMMUNITY): Payer: Self-pay | Admitting: Pharmacy Technician

## 2024-04-30 ENCOUNTER — Other Ambulatory Visit: Payer: Self-pay

## 2024-04-30 ENCOUNTER — Other Ambulatory Visit (HOSPITAL_COMMUNITY): Payer: Self-pay

## 2024-04-30 DIAGNOSIS — E119 Type 2 diabetes mellitus without complications: Secondary | ICD-10-CM | POA: Insufficient documentation

## 2024-04-30 DIAGNOSIS — Z7982 Long term (current) use of aspirin: Secondary | ICD-10-CM | POA: Insufficient documentation

## 2024-04-30 DIAGNOSIS — E7800 Pure hypercholesterolemia, unspecified: Secondary | ICD-10-CM | POA: Insufficient documentation

## 2024-04-30 DIAGNOSIS — M1711 Unilateral primary osteoarthritis, right knee: Secondary | ICD-10-CM | POA: Diagnosis not present

## 2024-04-30 DIAGNOSIS — G4730 Sleep apnea, unspecified: Secondary | ICD-10-CM | POA: Insufficient documentation

## 2024-04-30 DIAGNOSIS — Z7901 Long term (current) use of anticoagulants: Secondary | ICD-10-CM | POA: Insufficient documentation

## 2024-04-30 DIAGNOSIS — I10 Essential (primary) hypertension: Secondary | ICD-10-CM | POA: Insufficient documentation

## 2024-04-30 DIAGNOSIS — Z1211 Encounter for screening for malignant neoplasm of colon: Secondary | ICD-10-CM

## 2024-04-30 DIAGNOSIS — K219 Gastro-esophageal reflux disease without esophagitis: Secondary | ICD-10-CM | POA: Insufficient documentation

## 2024-04-30 DIAGNOSIS — Z01818 Encounter for other preprocedural examination: Secondary | ICD-10-CM

## 2024-04-30 DIAGNOSIS — Z7984 Long term (current) use of oral hypoglycemic drugs: Secondary | ICD-10-CM | POA: Insufficient documentation

## 2024-04-30 DIAGNOSIS — M179 Osteoarthritis of knee, unspecified: Secondary | ICD-10-CM | POA: Diagnosis present

## 2024-04-30 DIAGNOSIS — Z79899 Other long term (current) drug therapy: Secondary | ICD-10-CM | POA: Insufficient documentation

## 2024-04-30 DIAGNOSIS — Z87891 Personal history of nicotine dependence: Secondary | ICD-10-CM | POA: Insufficient documentation

## 2024-04-30 DIAGNOSIS — M4316 Spondylolisthesis, lumbar region: Secondary | ICD-10-CM | POA: Insufficient documentation

## 2024-04-30 HISTORY — PX: TOTAL KNEE ARTHROPLASTY: SHX125

## 2024-04-30 LAB — BASIC METABOLIC PANEL WITH GFR
Anion gap: 13 (ref 5–15)
BUN: 29 mg/dL — ABNORMAL HIGH (ref 8–23)
CO2: 25 mmol/L (ref 22–32)
Calcium: 10.3 mg/dL (ref 8.9–10.3)
Chloride: 101 mmol/L (ref 98–111)
Creatinine, Ser: 1.2 mg/dL — ABNORMAL HIGH (ref 0.44–1.00)
GFR, Estimated: 46 mL/min — ABNORMAL LOW (ref >60)
Glucose, Bld: 120 mg/dL — ABNORMAL HIGH (ref 70–99)
Potassium: 4 mmol/L (ref 3.5–5.1)
Sodium: 139 mmol/L (ref 135–145)

## 2024-04-30 LAB — HEMOGLOBIN A1C
Hgb A1c MFr Bld: 5.9 % — ABNORMAL HIGH (ref 4.8–5.6)
Mean Plasma Glucose: 122.63 mg/dL

## 2024-04-30 LAB — GLUCOSE, CAPILLARY
Glucose-Capillary: 130 mg/dL — ABNORMAL HIGH (ref 70–99)
Glucose-Capillary: 212 mg/dL — ABNORMAL HIGH (ref 70–99)
Glucose-Capillary: 171 mg/dL — ABNORMAL HIGH (ref 70–99)
Glucose-Capillary: 195 mg/dL — ABNORMAL HIGH (ref 70–99)

## 2024-04-30 SURGERY — ARTHROPLASTY, KNEE, TOTAL
Anesthesia: Spinal | Site: Knee | Laterality: Right

## 2024-04-30 MED ORDER — EPHEDRINE SULFATE-NACL 50-0.9 MG/10ML-% IV SOSY
PREFILLED_SYRINGE | INTRAVENOUS | Status: DC | PRN
  Administered 2024-04-30: 5 mg via INTRAVENOUS

## 2024-04-30 MED ORDER — METOCLOPRAMIDE HCL 5 MG PO TABS: 5.0000 mg | ORAL_TABLET | Freq: Three times a day (TID) | ORAL | Status: DC | PRN

## 2024-04-30 MED ORDER — ONDANSETRON HCL 4 MG/2ML IJ SOLN: 4.0000 mg | Freq: Four times a day (QID) | INTRAMUSCULAR | Status: DC | PRN

## 2024-04-30 MED ORDER — BISACODYL 10 MG RE SUPP: 10.0000 mg | Freq: Every day | RECTAL | Status: DC | PRN

## 2024-04-30 MED ORDER — ACETAMINOPHEN 325 MG PO TABS: 325.0000 mg | ORAL_TABLET | Freq: Four times a day (QID) | ORAL | Status: DC | PRN

## 2024-04-30 MED ORDER — SIMVASTATIN 40 MG PO TABS
40.0000 mg | ORAL_TABLET | Freq: Every day | ORAL | Status: DC
  Administered 2024-04-30: 40 mg via ORAL
  Filled 2024-04-30: qty 1

## 2024-04-30 MED ORDER — ACETAMINOPHEN 500 MG PO TABS: 1000.0000 mg | ORAL_TABLET | Freq: Once | ORAL | Status: AC

## 2024-04-30 MED ORDER — CALCIUM CARBONATE 1250 (500 CA) MG PO TABS
500.0000 mg | ORAL_TABLET | Freq: Every morning | ORAL | Status: DC
  Administered 2024-04-30 – 2024-05-01 (×2): 1250 mg via ORAL
  Filled 2024-04-30 (×2): qty 1

## 2024-04-30 MED ORDER — FENTANYL CITRATE PF 50 MCG/ML IJ SOSY: 25.0000 ug | PREFILLED_SYRINGE | INTRAMUSCULAR | Status: DC | PRN

## 2024-04-30 MED ORDER — TRANEXAMIC ACID-NACL 1000-0.7 MG/100ML-% IV SOLN
1000.0000 mg | INTRAVENOUS | Status: AC
  Administered 2024-04-30: 1000 mg via INTRAVENOUS
  Filled 2024-04-30: qty 100

## 2024-04-30 MED ORDER — DOCUSATE SODIUM 100 MG PO CAPS
100.0000 mg | ORAL_CAPSULE | Freq: Two times a day (BID) | ORAL | Status: DC
Start: 2024-04-30 — End: 2024-05-01
  Administered 2024-04-30 – 2024-05-01 (×2): 100 mg via ORAL
  Filled 2024-04-30 (×2): qty 1

## 2024-04-30 MED ORDER — DROPERIDOL 2.5 MG/ML IJ SOLN: 0.6250 mg | Freq: Once | INTRAMUSCULAR | Status: DC | PRN

## 2024-04-30 MED ORDER — FENTANYL CITRATE (PF) 100 MCG/2ML IJ SOLN
INTRAMUSCULAR | Status: AC
  Filled 2024-04-30: qty 2

## 2024-04-30 MED ORDER — LACTATED RINGERS IV SOLN: INTRAVENOUS | Status: DC

## 2024-04-30 MED ORDER — MIDAZOLAM HCL 2 MG/2ML IJ SOLN
INTRAMUSCULAR | Status: AC
  Filled 2024-04-30: qty 2

## 2024-04-30 MED ORDER — INSULIN ASPART 100 UNIT/ML IJ SOLN: 0.0000 [IU] | INTRAMUSCULAR | Status: DC | PRN

## 2024-04-30 MED ORDER — DULOXETINE HCL 30 MG PO CPEP
30.0000 mg | ORAL_CAPSULE | Freq: Two times a day (BID) | ORAL | Status: DC
  Administered 2024-04-30 – 2024-05-01 (×2): 30 mg via ORAL
  Filled 2024-04-30 (×2): qty 1

## 2024-04-30 MED ORDER — DIPHENHYDRAMINE HCL 12.5 MG/5ML PO ELIX: 12.5000 mg | ORAL_SOLUTION | ORAL | Status: DC | PRN

## 2024-04-30 MED ORDER — BUPIVACAINE LIPOSOME 1.3 % IJ SUSP
INTRAMUSCULAR | Status: AC
  Filled 2024-04-30: qty 20

## 2024-04-30 MED ORDER — ACETAMINOPHEN 10 MG/ML IV SOLN
1000.0000 mg | Freq: Four times a day (QID) | INTRAVENOUS | Status: DC
  Administered 2024-04-30: 1000 mg via INTRAVENOUS
  Filled 2024-04-30: qty 100

## 2024-04-30 MED ORDER — CHLORHEXIDINE GLUCONATE 0.12 % MT SOLN
15.0000 mL | Freq: Once | OROMUCOSAL | Status: AC
  Administered 2024-04-30: 15 mL via OROMUCOSAL

## 2024-04-30 MED ORDER — ORAL CARE MOUTH RINSE
15.0000 mL | Freq: Once | OROMUCOSAL | Status: AC
Start: 2024-04-30 — End: 2024-04-30

## 2024-04-30 MED ORDER — ONDANSETRON HCL 4 MG PO TABS: 4.0000 mg | ORAL_TABLET | Freq: Four times a day (QID) | ORAL | Status: DC | PRN

## 2024-04-30 MED ORDER — INSULIN ASPART 100 UNIT/ML IJ SOLN
0.0000 [IU] | Freq: Three times a day (TID) | INTRAMUSCULAR | Status: DC
  Administered 2024-04-30: 5 [IU] via SUBCUTANEOUS
  Administered 2024-04-30 – 2024-05-01 (×2): 3 [IU] via SUBCUTANEOUS

## 2024-04-30 MED ORDER — OXYCODONE HCL 5 MG PO TABS
10.0000 mg | ORAL_TABLET | ORAL | Status: DC | PRN
  Administered 2024-04-30 – 2024-05-01 (×5): 10 mg via ORAL
  Filled 2024-04-30 (×5): qty 2

## 2024-04-30 MED ORDER — CEFAZOLIN SODIUM-DEXTROSE 2-4 GM/100ML-% IV SOLN
2.0000 g | Freq: Four times a day (QID) | INTRAVENOUS | Status: AC
  Administered 2024-04-30 (×2): 2 g via INTRAVENOUS
  Filled 2024-04-30 (×2): qty 100

## 2024-04-30 MED ORDER — PROPOFOL 500 MG/50ML IV EMUL
INTRAVENOUS | Status: DC | PRN
  Administered 2024-04-30: 40 ug/kg/min via INTRAVENOUS
  Administered 2024-04-30: 100 mg via INTRAVENOUS

## 2024-04-30 MED ORDER — HYDROCHLOROTHIAZIDE 12.5 MG PO TABS
12.5000 mg | ORAL_TABLET | Freq: Every day | ORAL | Status: DC
  Administered 2024-05-01: 12.5 mg via ORAL
  Filled 2024-04-30: qty 1

## 2024-04-30 MED ORDER — PROPOFOL 10 MG/ML IV BOLUS
INTRAVENOUS | Status: AC
  Filled 2024-04-30: qty 20

## 2024-04-30 MED ORDER — BUPIVACAINE LIPOSOME 1.3 % IJ SUSP: 20.0000 mL | Freq: Once | INTRAMUSCULAR | Status: DC

## 2024-04-30 MED ORDER — OXYCODONE HCL 5 MG PO TABS: 5.0000 mg | ORAL_TABLET | Freq: Once | ORAL | Status: DC | PRN

## 2024-04-30 MED ORDER — METOCLOPRAMIDE HCL 5 MG/ML IJ SOLN: 5.0000 mg | Freq: Three times a day (TID) | INTRAMUSCULAR | Status: DC | PRN

## 2024-04-30 MED ORDER — OXYCODONE HCL 5 MG PO TABS
5.0000 mg | ORAL_TABLET | ORAL | Status: DC | PRN
  Filled 2024-04-30: qty 2

## 2024-04-30 MED ORDER — DEXAMETHASONE SODIUM PHOSPHATE 10 MG/ML IJ SOLN
INTRAMUSCULAR | Status: AC
  Filled 2024-04-30: qty 1

## 2024-04-30 MED ORDER — HYDROMORPHONE HCL 1 MG/ML IJ SOLN
0.5000 mg | INTRAMUSCULAR | Status: DC | PRN
  Administered 2024-04-30 – 2024-05-01 (×2): 1 mg via INTRAVENOUS
  Filled 2024-04-30 (×2): qty 1

## 2024-04-30 MED ORDER — GABAPENTIN 300 MG PO CAPS
300.0000 mg | ORAL_CAPSULE | Freq: Two times a day (BID) | ORAL | Status: DC
  Administered 2024-04-30 – 2024-05-01 (×2): 300 mg via ORAL
  Filled 2024-04-30 (×2): qty 1

## 2024-04-30 MED ORDER — DEXAMETHASONE SODIUM PHOSPHATE 10 MG/ML IJ SOLN
8.0000 mg | Freq: Once | INTRAMUSCULAR | Status: AC
  Administered 2024-04-30: 8 mg via INTRAVENOUS

## 2024-04-30 MED ORDER — FLEET ENEMA RE ENEM
1.0000 | ENEMA | Freq: Once | RECTAL | Status: DC | PRN
Start: 2024-04-30 — End: 2024-05-01

## 2024-04-30 MED ORDER — DEXAMETHASONE SODIUM PHOSPHATE 10 MG/ML IJ SOLN
10.0000 mg | Freq: Once | INTRAMUSCULAR | Status: AC
  Administered 2024-05-01: 10 mg via INTRAVENOUS
  Filled 2024-04-30: qty 1

## 2024-04-30 MED ORDER — STERILE WATER FOR IRRIGATION IR SOLN
Status: DC | PRN
  Administered 2024-04-30: 2000 mL

## 2024-04-30 MED ORDER — LOSARTAN POTASSIUM 50 MG PO TABS
100.0000 mg | ORAL_TABLET | Freq: Every day | ORAL | Status: DC
  Administered 2024-05-01: 100 mg via ORAL
  Filled 2024-04-30: qty 2

## 2024-04-30 MED ORDER — POLYETHYLENE GLYCOL 3350 17 G PO PACK
17.0000 g | PACK | Freq: Every day | ORAL | Status: DC | PRN
Start: 2024-04-30 — End: 2024-05-01

## 2024-04-30 MED ORDER — RIVAROXABAN 10 MG PO TABS
10.0000 mg | ORAL_TABLET | Freq: Every day | ORAL | Status: DC
  Administered 2024-05-01: 10 mg via ORAL
  Filled 2024-04-30 (×2): qty 1

## 2024-04-30 MED ORDER — ONDANSETRON HCL 4 MG/2ML IJ SOLN
INTRAMUSCULAR | Status: AC
  Filled 2024-04-30: qty 2

## 2024-04-30 MED ORDER — POVIDONE-IODINE 10 % EX SWAB: 2.0000 | Freq: Once | CUTANEOUS | Status: DC

## 2024-04-30 MED ORDER — CEFAZOLIN SODIUM-DEXTROSE 2-4 GM/100ML-% IV SOLN
2.0000 g | INTRAVENOUS | Status: AC
  Administered 2024-04-30: 2 g via INTRAVENOUS
  Filled 2024-04-30: qty 100

## 2024-04-30 MED ORDER — FESOTERODINE FUMARATE ER 4 MG PO TB24
4.0000 mg | ORAL_TABLET | Freq: Every day | ORAL | Status: DC
  Administered 2024-04-30 – 2024-05-01 (×2): 4 mg via ORAL
  Filled 2024-04-30 (×2): qty 1

## 2024-04-30 MED ORDER — FENTANYL CITRATE (PF) 100 MCG/2ML IJ SOLN
INTRAMUSCULAR | Status: DC | PRN
  Administered 2024-04-30: 25 ug via INTRAVENOUS
  Administered 2024-04-30: 50 ug via INTRAVENOUS
  Administered 2024-04-30: 25 ug via INTRAVENOUS

## 2024-04-30 MED ORDER — PANTOPRAZOLE SODIUM 40 MG PO TBEC
40.0000 mg | DELAYED_RELEASE_TABLET | Freq: Every day | ORAL | Status: DC
  Administered 2024-04-30 – 2024-05-01 (×2): 40 mg via ORAL
  Filled 2024-04-30 (×2): qty 1

## 2024-04-30 MED ORDER — ROPIVACAINE HCL 5 MG/ML IJ SOLN
INTRAMUSCULAR | Status: DC | PRN
  Administered 2024-04-30: 20 mL via PERINEURAL

## 2024-04-30 MED ORDER — METHOCARBAMOL 1000 MG/10ML IJ SOLN
500.0000 mg | Freq: Four times a day (QID) | INTRAMUSCULAR | Status: DC | PRN
  Filled 2024-04-30: qty 10

## 2024-04-30 MED ORDER — OXYCODONE HCL 5 MG/5ML PO SOLN: 5.0000 mg | Freq: Once | ORAL | Status: DC | PRN

## 2024-04-30 MED ORDER — SODIUM CHLORIDE (PF) 0.9 % IJ SOLN
INTRAMUSCULAR | Status: AC
  Filled 2024-04-30: qty 10

## 2024-04-30 MED ORDER — PHENOL 1.4 % MT LIQD: 1.0000 | OROMUCOSAL | Status: DC | PRN

## 2024-04-30 MED ORDER — ACETAMINOPHEN 500 MG PO TABS
1000.0000 mg | ORAL_TABLET | Freq: Four times a day (QID) | ORAL | Status: AC
  Administered 2024-04-30 – 2024-05-01 (×3): 1000 mg via ORAL
  Filled 2024-04-30 (×3): qty 2

## 2024-04-30 MED ORDER — LOSARTAN POTASSIUM-HCTZ 100-12.5 MG PO TABS: 1.0000 | ORAL_TABLET | Freq: Every morning | ORAL | Status: DC

## 2024-04-30 MED ORDER — METHOCARBAMOL 500 MG PO TABS
500.0000 mg | ORAL_TABLET | Freq: Four times a day (QID) | ORAL | Status: DC | PRN
  Filled 2024-04-30: qty 1

## 2024-04-30 MED ORDER — SODIUM CHLORIDE (PF) 0.9 % IJ SOLN
INTRAMUSCULAR | Status: AC
  Filled 2024-04-30: qty 50

## 2024-04-30 MED ORDER — ACETAMINOPHEN 10 MG/ML IV SOLN: 1000.0000 mg | Freq: Once | INTRAVENOUS | Status: DC | PRN

## 2024-04-30 MED ORDER — ONDANSETRON HCL 4 MG/2ML IJ SOLN
INTRAMUSCULAR | Status: DC | PRN
Start: 2024-04-30 — End: 2024-04-30
  Administered 2024-04-30: 4 mg via INTRAVENOUS

## 2024-04-30 MED ORDER — 0.9 % SODIUM CHLORIDE (POUR BTL) OPTIME
TOPICAL | Status: DC | PRN
  Administered 2024-04-30: 1000 mL

## 2024-04-30 MED ORDER — MENTHOL 3 MG MT LOZG: 1.0000 | LOZENGE | OROMUCOSAL | Status: DC | PRN

## 2024-04-30 MED ORDER — MIDAZOLAM HCL 5 MG/5ML IJ SOLN
INTRAMUSCULAR | Status: DC | PRN
  Administered 2024-04-30: 1 mg via INTRAVENOUS

## 2024-04-30 MED ORDER — SODIUM CHLORIDE 0.9 % IV SOLN: INTRAVENOUS | Status: DC

## 2024-04-30 MED ORDER — SODIUM CHLORIDE 0.9 % IR SOLN
Status: DC | PRN
Start: 2024-04-30 — End: 2024-04-30
  Administered 2024-04-30: 1000 mL

## 2024-04-30 MED ORDER — BUPIVACAINE IN DEXTROSE 0.75-8.25 % IT SOLN
INTRATHECAL | Status: DC | PRN
  Administered 2024-04-30: 1.6 mL via INTRATHECAL

## 2024-04-30 MED ORDER — PROPOFOL 1000 MG/100ML IV EMUL
INTRAVENOUS | Status: AC
  Filled 2024-04-30: qty 100

## 2024-04-30 MED ORDER — SODIUM CHLORIDE (PF) 0.9 % IJ SOLN
INTRAMUSCULAR | Status: DC | PRN
  Administered 2024-04-30: 80 mL

## 2024-04-30 SURGICAL SUPPLY — 44 items
ATTUNE MED DOME PAT 38 KNEE (Knees) IMPLANT
ATTUNE PS FEM RT SZ 5 CEM KNEE (Femur) IMPLANT
ATTUNE PSRP INSE SZ5 7 KNEE (Insert) IMPLANT
BAG COUNTER SPONGE SURGICOUNT (BAG) IMPLANT
BAG ZIPLOCK 12X15 (MISCELLANEOUS) ×1 IMPLANT
BASE TIBIAL ROT PLAT SZ 5 KNEE (Knees) IMPLANT
BLADE SAG 18X100X1.27 (BLADE) ×1 IMPLANT
BLADE SAW SGTL 11.0X1.19X90.0M (BLADE) ×1 IMPLANT
BNDG ELASTIC 6INX 5YD STR LF (GAUZE/BANDAGES/DRESSINGS) ×1 IMPLANT
BOWL SMART MIX CTS (DISPOSABLE) ×1 IMPLANT
CATH FOLEY 2WAY SLVR 5CC 14FR (CATHETERS) IMPLANT
CEMENT HV SMART SET (Cement) ×2 IMPLANT
COVER SURGICAL LIGHT HANDLE (MISCELLANEOUS) ×1 IMPLANT
CUFF TRNQT CYL 34X4.125X (TOURNIQUET CUFF) ×1 IMPLANT
DERMABOND ADVANCED .7 DNX12 (GAUZE/BANDAGES/DRESSINGS) ×1 IMPLANT
DRAPE U-SHAPE 47X51 STRL (DRAPES) ×1 IMPLANT
DRSG AQUACEL AG ADV 3.5X10 (GAUZE/BANDAGES/DRESSINGS) ×1 IMPLANT
DURAPREP 26ML APPLICATOR (WOUND CARE) ×1 IMPLANT
ELECT REM PT RETURN 15FT ADLT (MISCELLANEOUS) ×1 IMPLANT
GLOVE BIO SURGEON STRL SZ 6.5 (GLOVE) IMPLANT
GLOVE BIO SURGEON STRL SZ7 (GLOVE) IMPLANT
GLOVE BIO SURGEON STRL SZ8 (GLOVE) ×1 IMPLANT
GLOVE BIOGEL PI IND STRL 7.0 (GLOVE) ×1 IMPLANT
GLOVE BIOGEL PI IND STRL 8 (GLOVE) ×1 IMPLANT
GOWN STRL REUS W/ TWL LRG LVL3 (GOWN DISPOSABLE) ×1 IMPLANT
HOLDER FOLEY CATH W/STRAP (MISCELLANEOUS) ×1 IMPLANT
IMMOBILIZER KNEE 20 THIGH 36 (SOFTGOODS) ×1 IMPLANT
KIT TURNOVER KIT A (KITS) ×1 IMPLANT
MANIFOLD NEPTUNE II (INSTRUMENTS) ×1 IMPLANT
NS IRRIG 1000ML POUR BTL (IV SOLUTION) ×1 IMPLANT
PACK TOTAL KNEE CUSTOM (KITS) ×1 IMPLANT
PADDING CAST COTTON 6X4 STRL (CAST SUPPLIES) ×2 IMPLANT
PENCIL SMOKE EVACUATOR (MISCELLANEOUS) ×1 IMPLANT
PIN STEINMAN FIXATION KNEE (PIN) IMPLANT
PROTECTOR NERVE ULNAR (MISCELLANEOUS) ×1 IMPLANT
SET HNDPC FAN SPRY TIP SCT (DISPOSABLE) ×1 IMPLANT
SUT MNCRL AB 4-0 PS2 18 (SUTURE) ×1 IMPLANT
SUT VIC AB 2-0 CT1 TAPERPNT 27 (SUTURE) ×3 IMPLANT
SUTURE STRATFX 0 PDS 27 VIOLET (SUTURE) ×1 IMPLANT
TOWEL GREEN STERILE FF (TOWEL DISPOSABLE) ×1 IMPLANT
TRAY FOLEY MTR SLVR 16FR STAT (SET/KITS/TRAYS/PACK) ×1 IMPLANT
TUBE SUCTION HIGH CAP CLEAR NV (SUCTIONS) ×1 IMPLANT
WATER STERILE IRR 1000ML POUR (IV SOLUTION) ×2 IMPLANT
WRAP KNEE MAXI GEL POST OP (GAUZE/BANDAGES/DRESSINGS) ×1 IMPLANT

## 2024-04-30 NOTE — Progress Notes (Signed)
 Orthopedic Tech Progress Note Patient Details:  Phyllis Bell 04-Feb-1946 [id number] Applied CPM per order. Will remove at 1:10pm.  CPM Right Knee CPM Right Knee: On Right Knee Flexion (Degrees): 40 Right Knee Extension (Degrees): 10  Post Interventions Patient Tolerated: Well Instructions Provided: Adjustment of device, Care of device, Poper ambulation with device Ortho Devices Type of Ortho Device: CPM padding Ortho Device/Splint Location: RLE Ortho Device/Splint Interventions: Ordered, Application, Adjustment   Post Interventions Patient Tolerated: Well Instructions Provided: Adjustment of device, Care of device, Poper ambulation with device  [name] 04/30/2024, 9:13 AM

## 2024-04-30 NOTE — Progress Notes (Signed)
 Patient arrived to room. Alert and oriented x4. VSS. CPM on.

## 2024-04-30 NOTE — Anesthesia Procedure Notes (Signed)
 Procedure Name: MAC Date/Time: 04/30/2024 7:12 AM  Performed by: Memory Armida LABOR, CRNAPre-anesthesia Checklist: Emergency Drugs available, Patient identified, Suction available, Patient being monitored and Timeout performed Patient Re-evaluated:Patient Re-evaluated prior to induction Oxygen Delivery Method: Simple face mask Placement Confirmation: positive ETCO2 Dental Injury: Teeth and Oropharynx as per pre-operative assessment

## 2024-04-30 NOTE — Telephone Encounter (Signed)
 Patient Product/process development scientist completed.    The patient is insured through [employer]. Patient has Medicare and is not eligible for a copay card, but may be able to apply for patient assistance or Medicare RX Payment Plan (Patient Must reach out to their plan, if eligible for payment plan), if available.    Ran test claim for Xarelto  20 mg and the current 30 day co-pay is $408.75 due to a $590.00 deductible.   This test claim was processed through [facility]- copay amounts may vary at other pharmacies due to pharmacy/plan contracts, or as the patient moves through the different stages of their insurance plan.     Reyes Sharps, CPHT Pharmacy Technician III Certified Patient Advocate [facility] Patient Advocate Team Direct Number: [phone]  Fax: [phone]

## 2024-04-30 NOTE — Discharge Instructions (Addendum)
 Frank Aluisio, MD Total Joint Specialist [facility] [address] [place], [place] [address] [phone]  TOTAL KNEE REPLACEMENT POSTOPERATIVE DIRECTIONS    Knee Rehabilitation, Guidelines Following Surgery  Results after knee surgery are often greatly improved when you follow the exercise, range of motion and muscle strengthening exercises prescribed by your doctor. Safety measures are also important to protect the knee from further injury. If any of these exercises cause you to have increased pain or swelling in your knee joint, decrease the amount until you are comfortable again and slowly increase them. If you have problems or questions, call your caregiver or physical therapist for advice.   BLOOD CLOT PREVENTION Take a 10 mg Xarelto once a day for three weeks following surgery. Then take an 81 mg Aspirin once a day for three weeks. Then discontinue Aspirin. You may resume your vitamins/supplements once you have discontinued the Xarelto. Do not take any NSAIDs (Advil, Aleve, Ibuprofen, Meloxicam, etc.) until you have discontinued the Xarelto.   HOME CARE INSTRUCTIONS  Remove items at home which could result in a fall. This includes throw rugs or furniture in walking pathways.  ICE to the affected knee as much as tolerated. Icing helps control swelling. If the swelling is well controlled you will be more comfortable and rehab easier. Continue to use ice on the knee for pain and swelling from surgery. You may notice swelling that will progress down to the foot and ankle. This is normal after surgery. Elevate the leg when you are not up walking on it.    Continue to use the breathing machine which will help keep your temperature down. It is common for your temperature to cycle up and down following surgery, especially at night when you are not up moving around and exerting yourself. The breathing machine keeps your lungs expanded and your temperature  down. Do not place pillow under the operative knee, focus on keeping the knee straight while resting  DIET You may resume your previous home diet once you are discharged from the hospital.  DRESSING / WOUND CARE / SHOWERING Keep your bulky bandage on for 2 days. On the third post-operative day you may remove the Ace bandage and gauze. There is a waterproof adhesive bandage on your skin which will stay in place until your first follow-up appointment. Once you remove this you will not need to place another bandage You may begin showering 3 days following surgery, but do not submerge the incision under water.  ACTIVITY For the first 5 days, the key is rest and control of pain and swelling Do your home exercises twice a day starting on post-operative day 3. On the days you go to physical therapy, just do the home exercises once that day. You should rest, ice and elevate the leg for 50 minutes out of every hour. Get up and walk/stretch for 10 minutes per hour. After 5 days you can increase your activity slowly as tolerated. Walk with your walker as instructed. Use the walker until you are comfortable transitioning to a cane. Walk with the cane in the opposite hand of the operative leg. You may discontinue the cane once you are comfortable and walking steadily. Avoid periods of inactivity such as sitting longer than an hour when not asleep. This helps prevent blood clots.  You may discontinue the knee immobilizer once you are able to perform a straight leg raise while lying down. You may resume a sexual relationship in one month or   when given the OK by your doctor.  You may return to work once you are cleared by your doctor.  Do not drive a car for 6 weeks or until released by your surgeon.  Do not drive while taking narcotics.  TED HOSE STOCKINGS Wear the elastic stockings on both legs for three weeks following surgery during the day. You may remove them at night for sleeping.  WEIGHT  BEARING Weight bearing as tolerated with assist device (walker, cane, etc) as directed, use it as long as suggested by your surgeon or therapist, typically at least 4-6 weeks.  POSTOPERATIVE CONSTIPATION PROTOCOL Constipation - defined medically as fewer than three stools per week and severe constipation as less than one stool per week.  One of the most common issues patients have following surgery is constipation.  Even if you have a regular bowel pattern at home, your normal regimen is likely to be disrupted due to multiple reasons following surgery.  Combination of anesthesia, postoperative narcotics, change in appetite and fluid intake all can affect your bowels.  In order to avoid complications following surgery, here are some recommendations in order to help you during your recovery period.  Colace (docusate) - Pick up an over-the-counter form of Colace or another stool softener and take twice a day as long as you are requiring postoperative pain medications.  Take with a full glass of water daily.  If you experience loose stools or diarrhea, hold the colace until you stool forms back up. If your symptoms do not get better within 1 week or if they get worse, check with your doctor. Dulcolax (bisacodyl) - Pick up over-the-counter and take as directed by the product packaging as needed to assist with the movement of your bowels.  Take with a full glass of water.  Use this product as needed if not relieved by Colace only.  MiraLax (polyethylene glycol) - Pick up over-the-counter to have on hand. MiraLax is a solution that will increase the amount of water in your bowels to assist with bowel movements.  Take as directed and can mix with a glass of water, juice, soda, coffee, or tea. Take if you go more than two days without a movement. Do not use MiraLax more than once per day. Call your doctor if you are still constipated or irregular after using this medication for 7 days in a row.  If you continue  to have problems with postoperative constipation, please contact the office for further assistance and recommendations.  If you experience "the worst abdominal pain ever" or develop nausea or vomiting, please contact the office immediatly for further recommendations for treatment.  ITCHING If you experience itching with your medications, try taking only a single pain pill, or even half a pain pill at a time.  You can also use Benadryl over the counter for itching or also to help with sleep.   MEDICATIONS See your medication summary on the "After Visit Summary" that the nursing staff will review with you prior to discharge.  You may have some home medications which will be placed on hold until you complete the course of blood thinner medication.  It is important for you to complete the blood thinner medication as prescribed by your surgeon.  Continue your approved medications as instructed at time of discharge.  PRECAUTIONS If you experience chest pain or shortness of breath - call 911 immediately for transfer to the hospital emergency department.  If you develop a fever greater that 101 F, purulent   drainage from wound, increased redness or drainage from wound, foul odor from the wound/dressing, or calf pain - CONTACT YOUR SURGEON.                                                   FOLLOW-UP APPOINTMENTS Make sure you keep all of your appointments after your operation with your surgeon and caregivers. You should call the office at the above phone number and make an appointment for approximately two weeks after the date of your surgery or on the date instructed by your surgeon outlined in the "After Visit Summary".  RANGE OF MOTION AND STRENGTHENING EXERCISES  Rehabilitation of the knee is important following a knee injury or an operation. After just a few days of immobilization, the muscles of the thigh which control the knee become weakened and shrink (atrophy). Knee exercises are designed to build up  the tone and strength of the thigh muscles and to improve knee motion. Often times heat used for twenty to thirty minutes before working out will loosen up your tissues and help with improving the range of motion but do not use heat for the first two weeks following surgery. These exercises can be done on a training (exercise) mat, on the floor, on a table or on a bed. Use what ever works the best and is most comfortable for you Knee exercises include:  Leg Lifts - While your knee is still immobilized in a splint or cast, you can do straight leg raises. Lift the leg to 60 degrees, hold for 3 sec, and slowly lower the leg. Repeat 10-20 times 2-3 times daily. Perform this exercise against resistance later as your knee gets better.  Quad and Hamstring Sets - Tighten up the muscle on the front of the thigh (Quad) and hold for 5-10 sec. Repeat this 10-20 times hourly. Hamstring sets are done by pushing the foot backward against an object and holding for 5-10 sec. Repeat as with quad sets.  Leg Slides: Lying on your back, slowly slide your foot toward your buttocks, bending your knee up off the floor (only go as far as is comfortable). Then slowly slide your foot back down until your leg is flat on the floor again. Angel Wings: Lying on your back spread your legs to the side as far apart as you can without causing discomfort.  A rehabilitation program following serious knee injuries can speed recovery and prevent re-injury in the future due to weakened muscles. Contact your doctor or a physical therapist for more information on knee rehabilitation.   POST-OPERATIVE OPIOID TAPER INSTRUCTIONS: It is important to wean off of your opioid medication as soon as possible. If you do not need pain medication after your surgery it is ok to stop day one. Opioids include: Codeine, Hydrocodone(Norco, Vicodin), Oxycodone(Percocet, oxycontin) and hydromorphone amongst others.  Long term and even short term use of opiods can  cause: Increased pain response Dependence Constipation Depression Respiratory depression And more.  Withdrawal symptoms can include Flu like symptoms Nausea, vomiting And more Techniques to manage these symptoms Hydrate well Eat regular healthy meals Stay active Use relaxation techniques(deep breathing, meditating, yoga) Do Not substitute Alcohol to help with tapering If you have been on opioids for less than two weeks and do not have pain than it is ok to stop all together.  Plan to   wean off of opioids This plan should start within one week post op of your joint replacement. Maintain the same interval or time between taking each dose and first decrease the dose.  Cut the total daily intake of opioids by one tablet each day Next start to increase the time between doses. The last dose that should be eliminated is the evening dose.   IF YOU ARE TRANSFERRED TO A SKILLED REHAB FACILITY If the patient is transferred to a skilled rehab facility following release from the hospital, a list of the current medications will be sent to the facility for the patient to continue.  When discharged from the skilled rehab facility, please have the facility set up the patient's Home Health Physical Therapy prior to being released. Also, the skilled facility will be responsible for providing the patient with their medications at time of release from the facility to include their pain medication, the muscle relaxants, and their blood thinner medication. If the patient is still at the rehab facility at time of the two week follow up appointment, the skilled rehab facility will also need to assist the patient in arranging follow up appointment in our office and any transportation needs.  MAKE SURE YOU:  Understand these instructions.  Get help right away if you are not doing well or get worse.   DENTAL ANTIBIOTICS:  In most cases prophylactic antibiotics for Dental procdeures after total joint surgery are  not necessary.  Exceptions are as follows:  1. History of prior total joint infection  2. Severely immunocompromised (Organ Transplant, cancer chemotherapy, Rheumatoid biologic meds such as Humera)  3. Poorly controlled diabetes (A1C &gt; 8.0, blood glucose over 200)  If you have one of these conditions, contact your surgeon for an antibiotic prescription, prior to your dental procedure.    Pick up stool softner and laxative for home use following surgery while on pain medications. Do not submerge incision under water. Please use good hand washing techniques while changing dressing each day. May shower starting three days after surgery. Please use a clean towel to pat the incision dry following showers. Continue to use ice for pain and swelling after surgery. Do not use any lotions or creams on the incision until instructed by your surgeon.  Information on my medicine - XARELTO (Rivaroxaban)  Why was Xarelto prescribed for you? Xarelto was prescribed for you to reduce the risk of blood clots forming after orthopedic surgery. The medical term for these abnormal blood clots is venous thromboembolism (VTE).  What do you need to know about xarelto ? Take your Xarelto ONCE DAILY at the same time every day. You may take it either with or without food.  If you have difficulty swallowing the tablet whole, you may crush it and mix in applesauce just prior to taking your dose.  Take Xarelto exactly as prescribed by your doctor and DO NOT stop taking Xarelto without talking to the doctor who prescribed the medication.  Stopping without other VTE prevention medication to take the place of Xarelto may increase your risk of developing a clot.  After discharge, you should have regular check-up appointments with your healthcare provider that is prescribing your Xarelto.    What do you do if you miss a dose? If you miss a dose, take it as soon as you remember on the same day then  continue your regularly scheduled once daily regimen the next day. Do not take two doses of Xarelto on the same day.   Important   Safety Information A possible side effect of Xarelto is bleeding. You should call your healthcare provider right away if you experience any of the following: Bleeding from an injury or your nose that does not stop. Unusual colored urine (red or dark brown) or unusual colored stools (red or black). Unusual bruising for unknown reasons. A serious fall or if you hit your head (even if there is no bleeding).  Some medicines may interact with Xarelto and might increase your risk of bleeding while on Xarelto. To help avoid this, consult your healthcare provider or pharmacist prior to using any new prescription or non-prescription medications, including herbals, vitamins, non-steroidal anti-inflammatory drugs (NSAIDs) and supplements.  This website has more information on Xarelto: www.xarelto.com.    

## 2024-04-30 NOTE — Op Note (Signed)
 OPERATIVE REPORT-TOTAL KNEE ARTHROPLASTY   Pre-operative diagnosis- Osteoarthritis  Right knee(s)  Post-operative diagnosis- Osteoarthritis Right knee(s)  Procedure-  Right  Total Knee Arthroplasty  Surgeon- Dempsey GAILS. Tyann Niehaus, MD  Assistant- Randine Ricks, PA-C   Anesthesia-  GA combined with regional for post-op pain  EBL-50 mL   Drains None  Tourniquet time-  Total Tourniquet Time Documented: Thigh (Right) - 38 minutes Total: Thigh (Right) - 38 minutes     Complications- None  Condition-PACU - hemodynamically stable.   Brief Clinical Note  Phyllis Bell is a 78 y.o. year old female with end stage OA of her right knee with progressively worsening pain and dysfunction. She has constant pain, with activity and at rest and significant functional deficits with difficulties even with ADLs. She has had extensive non-op management including analgesics, injections of cortisone and viscosupplements, and home exercise program, but remains in significant pain with significant dysfunction.Radiographs show bone on bone arthritis patellofemoral with patellar erosion. She presents now for right Total Knee Arthroplasty.     Procedure in detail---   The patient is brought into the operating room and positioned supine on the operating table. After successful administration of  GA combined with regional for post-op pain,   a tourniquet is placed high on the  Right thigh(s) and the lower extremity is prepped and draped in the usual sterile fashion. Time out is performed by the operating team and then the  Right lower extremity is wrapped in Esmarch, knee flexed and the tourniquet inflated to 300 mmHg.       A midline incision is made with a ten blade through the subcutaneous tissue to the level of the extensor mechanism. A fresh blade is used to make a medial parapatellar arthrotomy. Soft tissue over the proximal medial tibia is subperiosteally elevated to the joint line with a knife and into  the semimembranosus bursa with a Cobb elevator. Soft tissue over the proximal lateral tibia is elevated with attention being paid to avoiding the patellar tendon on the tibial tubercle. The patella is everted, knee flexed 90 degrees and the ACL and PCL are removed. Findings are bone on bone patellofemoral with patellar erosion        The drill is used to create a starting hole in the distal femur and the canal is thoroughly irrigated with sterile saline to remove the fatty contents. The 5 degree Right  valgus alignment guide is placed into the femoral canal and the distal femoral cutting block is pinned to remove 10 mm off the distal femur. Resection is made with an oscillating saw.      The tibia is subluxed forward and the menisci are removed. The extramedullary alignment guide is placed referencing proximally at the medial aspect of the tibial tubercle and distally along the second metatarsal axis and tibial crest. The block is pinned to remove 2mm off the more deficient medial  side. Resection is made with an oscillating saw. Size 5is the most appropriate size for the tibia and the proximal tibia is prepared with the modular drill and keel punch for that size.      The femoral sizing guide is placed and size 5 is most appropriate. Rotation is marked off the epicondylar axis and confirmed by creating a rectangular flexion gap at 90 degrees. The size 5 cutting block is pinned in this rotation and the anterior, posterior and chamfer cuts are made with the oscillating saw. The intercondylar block is then placed and that cut is  made.      Trial size 5 tibial component, trial size 5 posterior stabilized femur and a 7  mm posterior stabilized rotating platform insert trial is placed. Full extension is achieved with excellent varus/valgus and anterior/posterior balance throughout full range of motion. The patella is everted and thickness measured to be 21  mm. Free hand resection is taken to 12 mm, a 38 template is  placed, lug holes are drilled, trial patella is placed, and it tracks normally. Osteophytes are removed off the posterior femur with the trial in place. All trials are removed and the cut bone surfaces prepared with pulsatile lavage. Cement is mixed and once ready for implantation, the size 5 tibial implant, size  5 posterior stabilized femoral component, and the size 38 patella are cemented in place and the patella is held with the clamp. The trial insert is placed and the knee held in full extension. The Exparel  (20 ml mixed with 60 ml saline) is injected into the extensor mechanism, posterior capsule, medial and lateral gutters and subcutaneous tissues.  All extruded cement is removed and once the cement is hard the permanent 7 mm posterior stabilized rotating platform insert is placed into the tibial tray.      The wound is copiously irrigated with saline solution and the extensor mechanism closed with # 0 Stratofix suture. The tourniquet is released for a total tourniquet time of 38  minutes. Flexion against gravity is 140 degrees and the patella tracks normally. Subcutaneous tissue is closed with 2.0 vicryl and subcuticular with running 4.0 Monocryl. The incision is cleaned and dried and steri-strips and a bulky sterile dressing are applied. The limb is placed into a knee immobilizer and the patient is awakened and transported to recovery in stable condition.      Please note that a surgical assistant was a medical necessity for this procedure in order to perform it in a safe and expeditious manner. Surgical assistant was necessary to retract the ligaments and vital neurovascular structures to prevent injury to them and also necessary for proper positioning of the limb to allow for anatomic placement of the prosthesis.   Dempsey ROCKFORD Harmoni Lucus, MD    04/30/2024, 8:23 AM

## 2024-04-30 NOTE — Anesthesia Postprocedure Evaluation (Signed)
 Anesthesia Post Note  Patient: Phyllis Bell  Procedure(s) Performed: ARTHROPLASTY, KNEE, TOTAL (Right: Knee)     Patient location during evaluation: PACU Anesthesia Type: Spinal and General Level of consciousness: awake and alert Pain management: pain level controlled Vital Signs Assessment: post-procedure vital signs reviewed and stable Respiratory status: spontaneous breathing, nonlabored ventilation, respiratory function stable and patient connected to nasal cannula oxygen Cardiovascular status: blood pressure returned to baseline and stable Postop Assessment: no apparent nausea or vomiting Anesthetic complications: no   No notable events documented.  Last Vitals:  Vitals:   04/30/24 1323 04/30/24 1711  BP: 139/76 (!) 127/57  Pulse: 73 70  Resp: 16 18  Temp: 36.6 C 36.5 C  SpO2: 100% 96%    Last Pain:  Vitals:   04/30/24 1711  TempSrc: Oral  PainSc:                  Thom JONELLE Peoples

## 2024-04-30 NOTE — Anesthesia Procedure Notes (Signed)
 Spinal  Patient location during procedure: OR Start time: 04/30/2024 7:15 AM End time: 04/30/2024 7:20 AM Reason for block: surgical anesthesia Staffing Performed: anesthesiologist  Anesthesiologist: Erma Thom SAUNDERS, MD Performed by: Erma Thom SAUNDERS, MD Authorized by: Erma Thom SAUNDERS, MD   Preanesthetic Checklist Completed: patient identified, IV checked, site marked, risks and benefits discussed, surgical consent, monitors and equipment checked, pre-op evaluation and timeout performed Spinal Block Patient position: sitting Prep: DuraPrep Patient monitoring: heart rate, cardiac monitor, continuous pulse ox and blood pressure Approach: midline Location: L4-5 Needle Needle type: Pencan  Needle gauge: 24 G Assessment Sensory level: T6 Additional Notes Functioning IV was confirmed and monitors were applied. Sterile prep and drape, including hand hygiene and sterile gloves were used. The patient was positioned and the spine was prepped. The skin was anesthetized with lidocaine .  Free flow of clear CSF was obtained prior to injecting local anesthetic into the CSF.  The spinal needle aspirated freely following injection.  The needle was carefully withdrawn.  The patient tolerated the procedure well.

## 2024-04-30 NOTE — Anesthesia Procedure Notes (Signed)
 Anesthesia Regional Block: Adductor canal block   Pre-Anesthetic Checklist: , timeout performed,  Correct Patient, Correct Site, Correct Laterality,  Correct Procedure, Correct Position, site marked,  Risks and benefits discussed,  Surgical consent,  Pre-op evaluation,  At surgeon's request and post-op pain management  Laterality: Right  Prep: chloraprep       Needles:  Injection technique: Single-shot  Needle Type: Echogenic Stimulator Needle     Needle Length: 9cm  Needle Gauge: 21     Additional Needles:   Procedures:,,,, ultrasound used (permanent image in chart),,    Narrative:  Start time: 04/30/2024 6:50 AM End time: 04/30/2024 6:55 AM Injection made incrementally with aspirations every 5 mL.  Performed by: Personally  Anesthesiologist: Erma Thom SAUNDERS, MD  Additional Notes: Discussed risks and benefits of the nerve block in detail, including but not limited vascular injury, permanent nerve damage and infection.   Patient tolerated the procedure well. Local anesthetic introduced in an incremental fashion under minimal resistance after negative aspirations. No paresthesias were elicited. After completion of the procedure, no acute issues were identified and patient continued to be monitored by RN.

## 2024-04-30 NOTE — Transfer of Care (Signed)
 Immediate Anesthesia Transfer of Care Note  Patient: Phyllis Bell  Procedure(s) Performed: ARTHROPLASTY, KNEE, TOTAL (Right: Knee)  Patient Location: PACU  Anesthesia Type:General  Level of Consciousness: drowsy and patient cooperative  Airway & Oxygen Therapy: Patient Spontanous Breathing and Patient connected to face mask oxygen  Post-op Assessment: Report given to RN and Post -op Vital signs reviewed and stable  Post vital signs: Reviewed and stable  Last Vitals:  Vitals Value Taken Time  BP 155/77 04/30/24 08:49  Temp    Pulse 71 04/30/24 08:51  Resp 14 04/30/24 08:51  SpO2 100 % 04/30/24 08:51  Vitals shown include unfiled device data.  Last Pain:  Vitals:   04/30/24 0623  TempSrc:   PainSc: 0-No pain         Complications: No notable events documented.

## 2024-04-30 NOTE — Interval H&P Note (Signed)
 History and Physical Interval Note:  04/30/2024 6:27 AM  [name]  has presented today for surgery, with the diagnosis of Right knee osteoarthrtis.  The various methods of treatment have been discussed with the patient and family. After consideration of risks, benefits and other options for treatment, the patient has consented to  Procedure(s): ARTHROPLASTY, KNEE, TOTAL (Right) as a surgical intervention.  The patient's history has been reviewed, patient examined, no change in status, stable for surgery.  I have reviewed the patient's chart and labs.  Questions were answered to the patient's satisfaction.     [name]

## 2024-04-30 NOTE — Evaluation (Signed)
 Physical Therapy Evaluation Patient Details Name: Phyllis Bell MRN: [id number] DOB: February 23, 1946 Today's Date: 04/30/2024  History of Present Illness  78 yo female s/p RTKA on 04/30/24. PMH: GERD, DM, HTN, sleep apnea, appy, PLIF L4-5, DM, uterine CA  Clinical Impression  Pt is s/p TKA resulting in the deficits listed below (see PT Problem List).  Pt motivated and agreeable to PT. Amb ~ 8' with RW and min assist, distance ltd by pain.  Anticipate steady progress in acute setting  Pt will benefit from acute skilled PT to increase their independence and safety with mobility to allow discharge.          If plan is discharge home, recommend the following: A little help with walking and/or transfers;Help with stairs or ramp for entrance;Assistance with cooking/housework;Assist for transportation   Can travel by private vehicle        Equipment Recommendations Rolling walker (2 wheels)  Recommendations for Other Services       Functional Status Assessment Patient has had a recent decline in their functional status and demonstrates the ability to make significant improvements in function in a reasonable and predictable amount of time.     Precautions / Restrictions Precautions Precautions: Fall;Knee Recall of Precautions/Restrictions: Intact Required Braces or Orthoses: Knee Immobilizer - Right Restrictions Weight Bearing Restrictions Per Provider Order: No RLE Weight Bearing Per Provider Order: Weight bearing as tolerated      Mobility  Bed Mobility Overal bed mobility: Needs Assistance Bed Mobility: Supine to Sit     Supine to sit: Min assist     General bed mobility comments: assist for RLE, cues to self assist    Transfers Overall transfer level: Needs assistance Equipment used: Rolling walker (2 wheels) Transfers: Sit to/from Stand Sit to Stand: Min assist           General transfer comment: cues for hand placement and RLE position     Ambulation/Gait Ambulation/Gait assistance: Min assist Gait Distance (Feet): 8 Feet Assistive device: Rolling walker (2 wheels) Gait Pattern/deviations: Step-to pattern, Decreased stance time - right       General Gait Details: cues for sequence, RW position  Stairs            Wheelchair Mobility     Tilt Bed    Modified Rankin (Stroke Patients Only)       Balance                                             Pertinent Vitals/Pain Pain Assessment Pain Assessment: Faces Faces Pain Scale: Hurts a little bit Pain Location: R knee Pain Descriptors / Indicators: Discomfort Pain Intervention(s): Limited activity within patient's tolerance, Monitored during session, Premedicated before session    Home Living Family/patient expects to be discharged to:: Private residence Living Arrangements: Spouse/significant other;Children Available Help at Discharge: Family Type of Home: House Home Access: Stairs to enter Entrance Stairs-Rails: None Entrance Stairs-Number of Steps: 2   Home Layout: One level Home Equipment: Shower seat;BSC/3in1;Other (comment) Additional Comments: walking stick    Prior Function Prior Level of Function : Independent/Modified Independent             Mobility Comments: amb with walking stick       Extremity/Trunk Assessment   Upper Extremity Assessment Upper Extremity Assessment: Overall WFL for tasks assessed    Lower Extremity Assessment Lower Extremity Assessment:  RLE deficits/detail RLE Deficits / Details: ankle WFL, 15 degree quad lag, hip grossly 3/5, anticiapted post op deficits       Communication   Communication Communication: No apparent difficulties    Cognition Arousal: Alert Behavior During Therapy: WFL for tasks assessed/performed   PT - Cognitive impairments: No apparent impairments                         Following commands: Intact       Cueing Cueing Techniques: Verbal  cues     General Comments      Exercises     Assessment/Plan    PT Assessment Patient needs continued PT services  PT Problem List Decreased strength;Decreased range of motion;Decreased activity tolerance;Decreased balance;Decreased mobility;Decreased knowledge of use of DME;Pain       PT Treatment Interventions DME instruction;Therapeutic activities;Gait training;Functional mobility training;Therapeutic exercise;Patient/family education;Stair training    PT Goals (Current goals can be found in the Care Plan section)  Acute Rehab PT Goals PT Goal Formulation: With patient Time For Goal Achievement: 05/14/24 Potential to Achieve Goals: Good    Frequency 7X/week     Co-evaluation               AM-PAC PT 6 Clicks Mobility  Outcome Measure Help needed turning from your back to your side while in a flat bed without using bedrails?: A Little Help needed moving from lying on your back to sitting on the side of a flat bed without using bedrails?: A Little Help needed moving to and from a bed to a chair (including a wheelchair)?: A Little Help needed standing up from a chair using your arms (e.g., wheelchair or bedside chair)?: A Little Help needed to walk in hospital room?: A Little Help needed climbing 3-5 steps with a railing? : A Lot 6 Click Score: 17    End of Session Equipment Utilized During Treatment: Gait belt Activity Tolerance: Patient tolerated treatment well Patient left: with call bell/phone within reach;in chair;with chair alarm set;with family/visitor present Nurse Communication: Mobility status PT Visit Diagnosis: Other abnormalities of gait and mobility (R26.89)    Time: 8851-8790 PT Time Calculation (min) (ACUTE ONLY): 21 min   Charges:   PT Evaluation $PT Eval Low Complexity: 1 Low   PT General Charges $$ ACUTE PT VISIT: 1 Visit         Lyric Hoar, PT  Acute Rehab Dept ([facility]) [phone]  04/30/2024   [facility] 04/30/2024, 1:35 PM

## 2024-04-30 NOTE — Anesthesia Procedure Notes (Signed)
 Procedure Name: LMA Insertion Date/Time: 04/30/2024 7:46 AM  Performed by: Memory Armida LABOR, CRNAPre-anesthesia Checklist: Patient identified, Emergency Drugs available, Suction available, Patient being monitored and Timeout performed Patient Re-evaluated:Patient Re-evaluated prior to induction Oxygen Delivery Method: Circle system utilized Preoxygenation: Pre-oxygenation with 100% oxygen Induction Type: IV induction Ventilation: Mask ventilation without difficulty LMA: LMA with gastric port inserted LMA Size: 4.0 Number of attempts: 2 Placement Confirmation: positive ETCO2 and breath sounds checked- equal and bilateral Tube secured with: Tape Dental Injury: Teeth and Oropharynx as per pre-operative assessment

## 2024-05-01 ENCOUNTER — Telehealth (HOSPITAL_COMMUNITY): Payer: Self-pay | Admitting: Pharmacy Technician

## 2024-05-01 ENCOUNTER — Encounter (HOSPITAL_COMMUNITY): Payer: Self-pay | Admitting: Orthopedic Surgery

## 2024-05-01 ENCOUNTER — Other Ambulatory Visit (HOSPITAL_COMMUNITY): Payer: Self-pay

## 2024-05-01 DIAGNOSIS — M1711 Unilateral primary osteoarthritis, right knee: Secondary | ICD-10-CM | POA: Diagnosis not present

## 2024-05-01 LAB — CBC
HCT: 30.5 % — ABNORMAL LOW (ref 36.0–46.0)
Hemoglobin: 9.5 g/dL — ABNORMAL LOW (ref 12.0–15.0)
MCH: 28.2 pg (ref 26.0–34.0)
MCHC: 31.1 g/dL (ref 30.0–36.0)
MCV: 90.5 fL (ref 80.0–100.0)
Platelets: 225 K/uL (ref 150–400)
RBC: 3.37 MIL/uL — ABNORMAL LOW (ref 3.87–5.11)
RDW: 14.6 % (ref 11.5–15.5)
WBC: 10.1 K/uL (ref 4.0–10.5)
nRBC: 0 % (ref 0.0–0.2)

## 2024-05-01 LAB — BASIC METABOLIC PANEL WITH GFR
Anion gap: 12 (ref 5–15)
BUN: 17 mg/dL (ref 8–23)
CO2: 22 mmol/L (ref 22–32)
Calcium: 9.3 mg/dL (ref 8.9–10.3)
Chloride: 103 mmol/L (ref 98–111)
Creatinine, Ser: 0.91 mg/dL (ref 0.44–1.00)
GFR, Estimated: >60 mL/min (ref >60)
Glucose, Bld: 145 mg/dL — ABNORMAL HIGH (ref 70–99)
Potassium: 4.5 mmol/L (ref 3.5–5.1)
Sodium: 137 mmol/L (ref 135–145)

## 2024-05-01 LAB — GLUCOSE, CAPILLARY
Glucose-Capillary: 106 mg/dL — ABNORMAL HIGH (ref 70–99)
Glucose-Capillary: 178 mg/dL — ABNORMAL HIGH (ref 70–99)

## 2024-05-01 MED ORDER — METHOCARBAMOL 500 MG PO TABS
500.0000 mg | ORAL_TABLET | Freq: Four times a day (QID) | ORAL | 0 refills | Status: DC | PRN
  Filled 2024-05-01: qty 40, 10d supply, fill #0

## 2024-05-01 MED ORDER — OXYCODONE HCL 5 MG PO TABS
5.0000 mg | ORAL_TABLET | Freq: Four times a day (QID) | ORAL | 0 refills | Status: DC | PRN
  Filled 2024-05-01: qty 42, 6d supply, fill #0

## 2024-05-01 MED ORDER — RIVAROXABAN 10 MG PO TABS
10.0000 mg | ORAL_TABLET | Freq: Every day | ORAL | 0 refills | Status: AC
  Filled 2024-05-01: qty 20, 20d supply, fill #0

## 2024-05-01 MED ORDER — ONDANSETRON HCL 4 MG PO TABS
4.0000 mg | ORAL_TABLET | Freq: Four times a day (QID) | ORAL | 0 refills | Status: DC | PRN
  Filled 2024-05-01: qty 20, 5d supply, fill #0

## 2024-05-01 NOTE — TOC Transition Note (Signed)
 Transition of Care [facility]) - Discharge Note   Patient Details  Name: Phyllis Bell MRN: [id number] Date of Birth: March 01, 1946  Transition of Care [facility]) CM/SW Contact:  NORMAN ASPEN, LCSW Phone Number: 05/01/2024, 10:42 AM   Clinical Narrative:     Met with pt who confirms need for RW and no DME agency preference.  RW ordered with [employer] and delivered to room.  OPPT already arranged with [facility].  No further TOC needs.  Final next level of care: OP Rehab Barriers to Discharge: No Barriers Identified   Patient Goals and CMS Choice Patient states their goals for this hospitalization and ongoing recovery are:: return home          Discharge Placement                       Discharge Plan and Services Additional resources added to the After Visit Summary for                  DME Arranged: Walker rolling DME Agency: [employer] Date DME Agency Contacted: 05/01/24 Time DME Agency Contacted: [id number] Representative spoke with at DME Agency: [facility]            Social Drivers of Health (SDOH) Interventions SDOH Screenings   Food Insecurity: No Food Insecurity (04/30/2024)  Housing: Low Risk  (04/30/2024)  Transportation Needs: No Transportation Needs (04/30/2024)  Utilities: Not At Risk (04/30/2024)  Social Connections: Socially Integrated (04/30/2024)  Tobacco Use: Medium Risk (04/30/2024)     Readmission Risk Interventions     No data to display

## 2024-05-01 NOTE — Progress Notes (Signed)
 Subjective: 1 Day Post-Op Procedure(s) (LRB): ARTHROPLASTY, KNEE, TOTAL (Right) Patient reports pain as mild.   Patient seen in rounds by Dr. Melodi. Patient is well, and has had no acute complaints or problems No issues overnight. Denies chest pain, SOB, or calf pain. Foley catheter removed this AM.  We will continue therapy today, ambulated 8' yesterday.   Objective: Vital signs in last 24 hours: Temp:  [97.5 F (36.4 C)-98 F (36.7 C)] 98 F (36.7 C) (09/09 0513) Pulse Rate:  [65-73] 70 (09/09 0513) Resp:  [13-24] 15 (09/09 0513) BP: (115-171)/(56-94) 124/62 (09/09 0513) SpO2:  [94 %-100 %] 97 % (09/09 0513)  Intake/Output from previous day:  Intake/Output Summary (Last 24 hours) at 05/01/2024 0839 Last data filed at 05/01/2024 0730 Gross per 24 hour  Intake 1875.28 ml  Output 3100 ml  Net -1224.72 ml     Intake/Output this shift: Total I/O In: -  Out: 1900 [Urine:1900]  Labs: Recent Labs    05/01/24 0323  HGB 9.5*   Recent Labs    05/01/24 0323  WBC 10.1  RBC 3.37*  HCT 30.5*  PLT 225   Recent Labs    04/30/24 0605 05/01/24 0323  NA 139 137  K 4.0 4.5  CL 101 103  CO2 25 22  BUN 29* 17  CREATININE 1.20* 0.91  GLUCOSE 120* 145*  CALCIUM  10.3 9.3   No results for input(s): LABPT, INR in the last 72 hours.  Exam: General - Patient is Alert and Oriented Extremity - Neurologically intact Neurovascular intact Sensation intact distally Dorsiflexion/Plantar flexion intact Dressing - dressing C/D/I Motor Function - intact, moving foot and toes well on exam.   Past Medical History:  Diagnosis Date   Arthritis    Cancer (HCC) 2006   Uterine cancer   Diabetes mellitus without complication (HCC)    Type II   GERD (gastroesophageal reflux disease)    Hypercholesteremia    Hypertension    Sleep apnea    no cpap   Spondylolisthesis of lumbar region     Assessment/Plan: 1 Day Post-Op Procedure(s) (LRB): ARTHROPLASTY, KNEE, TOTAL  (Right) Principal Problem:   OA (osteoarthritis) of knee  Estimated body mass index is 26.31 kg/m as calculated from the following:   Height as of this encounter: 5' 9.5 (1.765 m).   Weight as of this encounter: 82 kg. Advance diet Up with therapy D/C IV fluids   Patient's anticipated LOS is less than 2 midnights, meeting these requirements: - Lives within 1 hour of care - Has a competent adult at home to recover with post-op recover - NO history of  - Chronic pain requiring opiods  - Diabetes  - Coronary Artery Disease  - Heart failure  - Heart attack  - Stroke  - DVT/VTE  - Cardiac arrhythmia  - Respiratory Failure/COPD  - Renal failure  - Anemia  - Advanced Liver disease   DVT Prophylaxis - Xarelto  Weight bearing as tolerated. Continue therapy.  Plan is to go Home after hospital stay. Plan for discharge later today if progresses with therapy and meeting goals. Scheduled for OPPT at [facility]. Follow-up in the office in 2 weeks.  Currently on Xarelto  for DVT prophylaxis due to hx of cancer. Checking with pharmacy to see if Eliquis is more affordable option, will change if so.  The PDMP database was reviewed today prior to any opioid medications being prescribed to this patient.  Roxie Mess, PA-C Orthopedic Surgery [phone] 05/01/2024, 8:39 AM

## 2024-05-01 NOTE — Telephone Encounter (Signed)
 Patient Product/process development scientist completed.    The patient is insured through [employer]. Patient has Medicare and is not eligible for a copay card, but may be able to apply for patient assistance or Medicare RX Payment Plan (Patient Must reach out to their plan, if eligible for payment plan), if available.    Ran test claim for Eliquis 2.5 mg and the current 20 day co-pay is $363.29 due to a $590.00 deductible.  Ran test claim for Xarelto  10 mg and the current 20 day co-pay is $362.01 due to a $590.00 deductible.  This test claim was processed through [facility]- copay amounts may vary at other pharmacies due to pharmacy/plan contracts, or as the patient moves through the different stages of their insurance plan.     Phyllis Bell, CPHT Pharmacy Technician III Certified Patient Advocate [facility] Patient Advocate Team Direct Number: [phone]  Fax: [phone]

## 2024-05-01 NOTE — Progress Notes (Signed)
 Physical Therapy Treatment Patient Details Name: Phyllis Bell MRN: [id number] DOB: 09-22-1945 Today's Date: 05/01/2024   History of Present Illness 78 yo female s/p RTKA on 04/30/24. 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 Will see for second session and pt will likely be ready to d/c later this afternoon    If plan is discharge home, recommend the following: A little help with walking and/or transfers;Help with stairs or ramp for entrance;Assistance with cooking/housework;Assist for transportation   Can travel by private vehicle        Equipment Recommendations  Rolling walker (2 wheels) (delivered-PT adjusted)    Recommendations for Other Services       Precautions / Restrictions Precautions Precautions: Fall;Knee Recall of Precautions/Restrictions: Intact Precaution/Restrictions Comments: ind SLR, baseline difficulty with terminal knee ext Restrictions RLE Weight Bearing Per Provider Order: Weight bearing as tolerated     Mobility  Bed Mobility   Bed Mobility: Supine to Sit     Supine to sit: Contact guard     General bed mobility comments: cues to self assist RLE with gait belt    Transfers Overall transfer level: Needs assistance Equipment used: Rolling walker (2 wheels) Transfers: Sit to/from Stand Sit to Stand: Contact guard assist, Supervision           General transfer comment: cues for hand placement and RLE position    Ambulation/Gait Ambulation/Gait assistance: Contact guard assist Gait Distance (Feet): 65 Feet Assistive device: Rolling walker (2 wheels) Gait Pattern/deviations: Step-to pattern, Decreased stance time - right       General Gait Details: cues for sequence, RW position and safety with turns   Stairs             Wheelchair Mobility     Tilt Bed    Modified Rankin (Stroke Patients Only)       Balance                                             Communication Communication Communication: No apparent difficulties  Cognition Arousal: Alert Behavior During Therapy: WFL for tasks assessed/performed   PT - Cognitive impairments: No apparent impairments                         Following commands: Intact      Cueing Cueing Techniques: Verbal cues  Exercises Total Joint Exercises Ankle Circles/Pumps: AROM, Both, 10 reps Quad Sets: AROM, Strengthening, Both, 5 reps Heel Slides: AAROM, 10 reps, Right Straight Leg Raises: AROM, Strengthening, 10 reps, AAROM    General Comments        Pertinent Vitals/Pain Pain Assessment Pain Assessment: 0-10 Pain Score: 5  Pain Location: R knee Pain Descriptors / Indicators: Discomfort Pain Intervention(s): Limited activity within patient's tolerance, Monitored during session, Premedicated before session, Repositioned    Home Living                          Prior Function            PT Goals (current goals can now be found in the care plan section) Acute Rehab PT Goals PT Goal Formulation: With patient Time For Goal Achievement: 05/14/24 Potential to Achieve Goals: Good Progress towards PT  goals: Progressing toward goals    Frequency    7X/week      PT Plan      Co-evaluation              AM-PAC PT 6 Clicks Mobility   Outcome Measure  Help needed turning from your back to your side while in a flat bed without using bedrails?: A Little Help needed moving from lying on your back to sitting on the side of a flat bed without using bedrails?: A Little Help needed moving to and from a bed to a chair (including a wheelchair)?: A Little Help needed standing up from a chair using your arms (e.g., wheelchair or bedside chair)?: A Little Help needed to walk in hospital room?: A Little Help needed climbing 3-5 steps with a railing? : A Little 6 Click Score: 18    End of Session Equipment  Utilized During Treatment: Gait belt Activity Tolerance: Patient tolerated treatment well Patient left: in chair;with call bell/phone within reach;with chair alarm set Nurse Communication: Mobility status PT Visit Diagnosis: Other abnormalities of gait and mobility (R26.89)     Time: 0940-1010 PT Time Calculation (min) (ACUTE ONLY): 30 min  Charges:    $Gait Training: 8-22 mins $Therapeutic Exercise: 8-22 mins PT General Charges $$ ACUTE PT VISIT: 1 Visit                     Rexene, PT  Acute Rehab Dept [facility]) [phone]  05/01/2024    [facility] 05/01/2024, 10:47 AM

## 2024-05-01 NOTE — Progress Notes (Signed)
 Discharge instructions given to patient and family, questions asked and answered.

## 2024-05-01 NOTE — Progress Notes (Signed)
 PT TX NOTE  05/01/24 1400  PT Visit Information  Last PT Received On 05/01/24  Assistance Needed Pt making excellent progress, meeting goals and is ready for d/c from PT standpoint with family assist prn.  History of Present Illness 78 yo female s/p RTKA on 04/30/24. PMH: GERD, DM, HTN, sleep apnea, appy, PLIF L4-5, DM, uterine CA  Precautions  Precautions Fall;Knee  Recall of Precautions/Restrictions Intact  Precaution/Restrictions Comments ind SLR, baseline difficulty with terminal knee ext  Restrictions  RLE Weight Bearing Per Provider Order WBAT  Pain Assessment  Pain Assessment 0-10  Pain Score 5  Pain Location R knee  Pain Descriptors / Indicators Discomfort  Pain Intervention(s) Limited activity within patient's tolerance;Monitored during session;Premedicated before session;Repositioned  Cognition  Arousal Alert  Behavior During Therapy WFL for tasks assessed/performed  PT - Cognitive impairments No apparent impairments  Following Commands  Following commands Intact  Cueing  Cueing Techniques Verbal cues  Communication  Communication No apparent difficulties  Bed Mobility  General bed mobility comments  (pt in recliner and returned to same)  Transfers  Overall transfer level Needs assistance  Equipment used Rolling walker (2 wheels)  Transfers Sit to/from Stand  Sit to Stand Contact guard assist;Supervision  General transfer comment cues for hand placement and RLE position  Ambulation/Gait  Ambulation/Gait assistance Contact guard assist  Gait Distance (Feet) 90 Feet  Assistive device Rolling walker (2 wheels)  Gait Pattern/deviations Step-to pattern;Decreased stance time - right  General Gait Details cues for sequence, RW position and safety with turns. steady with RW, no LOB  Stairs Yes  Stairs assistance Contact guard assist  Stair Management Forwards;With walker  Number of Stairs 3  General stair comments cues for sequence and  technique.  no LOB or knee  instability. dtr present and able to cue as needed  PT - End of Session  Equipment Utilized During Treatment Gait belt  Activity Tolerance Patient tolerated treatment well  Patient left in chair;with call bell/phone within reach;with chair alarm set  Nurse Communication Mobility status   PT - Assessment/Plan  PT Visit Diagnosis Other abnormalities of gait and mobility (R26.89)  PT Frequency (ACUTE ONLY) 7X/week  Follow Up Recommendations Follow physician's recommendations for discharge plan and follow up therapies  Patient can return home with the following A little help with walking and/or transfers;Help with stairs or ramp for entrance;Assistance with cooking/housework;Assist for transportation  PT equipment Rolling walker (2 wheels) (delivered-PT adjusted)  AM-PAC PT 6 Clicks Mobility Outcome Measure (Version 2)  Help needed turning from your back to your side while in a flat bed without using bedrails? 3  Help needed moving from lying on your back to sitting on the side of a flat bed without using bedrails? 3  Help needed moving to and from a bed to a chair (including a wheelchair)? 3  Help needed standing up from a chair using your arms (e.g., wheelchair or bedside chair)? 3  Help needed to walk in hospital room? 3  Help needed climbing 3-5 steps with a railing?  3  6 Click Score 18  Consider Recommendation of Discharge To: Home with [facility]  PT Goal Progression  Progress towards PT goals Progressing toward goals  Acute Rehab PT Goals  PT Goal Formulation With patient  Time For Goal Achievement 05/14/24  Potential to Achieve Goals Good  PT Time Calculation  PT Start Time (ACUTE ONLY) 1402  PT Stop Time (ACUTE ONLY) 1422  PT Time Calculation (min) (ACUTE ONLY) 20  min  PT General Charges  $$ ACUTE PT VISIT 1 Visit  PT Treatments  $Gait Training 8-22 mins

## 2024-05-01 NOTE — Progress Notes (Signed)
 Discharge meds in a secure bag delivered to pt in room by this RN

## 2024-07-16 ENCOUNTER — Other Ambulatory Visit (HOSPITAL_COMMUNITY): Payer: Self-pay

## 2024-08-14 ENCOUNTER — Other Ambulatory Visit (HOSPITAL_COMMUNITY): Payer: Self-pay

## 2024-09-06 NOTE — Patient Instructions (Addendum)
 SURGICAL WAITING ROOM VISITATION Patients having surgery or a procedure may have no more than 2 support people in the waiting area - these visitors may rotate.    Children under the age of 39 will not be allowed to visit due to the increase in respiratory illness  Children under the age of 72 must have an adult with them who is not the patient.  If the patient needs to stay at the hospital during part of their recovery, the visitor guidelines for inpatient rooms apply. Pre-op nurse will coordinate an appropriate time for 1 support person to accompany patient in pre-op.  This support person may not rotate.    Please refer to the [facility] website for the visitor guidelines for Inpatients (after your surgery is over and you are in a regular room).     Your procedure is scheduled on: 09-24-24   Report to [facility] Main Entrance    Report to admitting at 11:10 AM   Call this number if you have problems the morning of surgery [phone]   Do not eat food :After Midnight.   After Midnight you may have the following liquids until 10:30 AM DAY OF SURGERY  Water  Non-Citrus Juices (without pulp, NO RED-Apple, White grape, White cranberry) Black Coffee (NO MILK/CREAM OR CREAMERS, sugar ok)  Clear Tea (NO MILK/CREAM OR CREAMERS, sugar ok) regular and decaf                             Plain Jell-O (NO RED)                                           Fruit ices (not with fruit pulp, NO RED)                                     Popsicles (NO RED)                                                               Sports drinks like Gatorade (NO RED)                   The day of surgery:  Drink ONE (1) Pre-Surgery G2 by 10:30 AM the morning of surgery. Drink in one sitting. Do not sip.  This drink was given to you during your hospital  pre-op appointment visit. Nothing else to drink after completing the Pre-Surgery G2.          If you have questions, please contact your surgeons  office.   FOLLOW  ANY ADDITIONAL PRE OP INSTRUCTIONS YOU RECEIVED FROM YOUR SURGEON'S OFFICE!!!     Oral Hygiene is also important to reduce your risk of infection.                                    Remember - BRUSH YOUR TEETH THE MORNING OF SURGERY WITH YOUR REGULAR TOOTHPASTE   Do NOT smoke after Midnight   Take these medicines  the morning of surgery with A SIP OF WATER :    Duloxetine  (Cymbalta )   Gabapentin  (Neurontin )   Omeprazole (Prilosec)   Solifenacin (Vesicare)   Tylenol  if needed  Stop all vitamins and herbal supplements 7 days before surgery  Do not take Losartan -Hydrochlorothiazide  the day of surgery  DO NOT TAKE ANY ORAL DIABETIC MEDICATIONS DAY OF YOUR SURGERY (do not take Metformin  the morning of surgery)                              You may not have any metal on your body including hair pins, jewelry, and body piercing             Do not wear make-up, lotions, powders, perfumes, or deodorant  Do not wear nail polish including gel and S&S, artificial/acrylic nails, or any other type of covering on natural nails including finger and toenails. If you have artificial nails, gel coating, etc. that needs to be removed by a nail salon please have this removed prior to surgery or surgery may need to be canceled/ delayed if the surgeon/ anesthesia feels like they are unable to be safely monitored.   Do not shave  48 hours prior to surgery.              Do not bring valuables to the hospital. [facility] IS NOT RESPONSIBLE   FOR VALUABLES.   Contacts, dentures or bridgework may not be worn into surgery.   Bring small overnight bag day of surgery.   DO NOT BRING YOUR HOME MEDICATIONS TO THE HOSPITAL. PHARMACY WILL DISPENSE MEDICATIONS LISTED ON YOUR MEDICATION LIST TO YOU DURING YOUR ADMISSION IN THE HOSPITAL!    Special Instructions: Bring a copy of your healthcare power of attorney and living will documents the day of surgery if you haven't scanned them before.               Please read over the following fact sheets you were given: IF YOU HAVE QUESTIONS ABOUT YOUR PRE-OP INSTRUCTIONS PLEASE CALL [phone] Gwen  If you received a COVID test during your pre-op visit  it is requested that you wear a mask when out in public, stay away from anyone that may not be feeling well and notify your surgeon if you develop symptoms. If you test positive for Covid or have been in contact with anyone that has tested positive in the last 10 days please notify you surgeon.   Pre-operative 4 CHG Bath Instructions  DYNA-Hex 4 Chlorhexidine  Gluconate 4% Solution Antiseptic 4 fl. oz   You can play a key role in reducing the risk of infection after surgery. Your skin needs to be as free of germs as possible. You can reduce the number of germs on your skin by washing with CHG (chlorhexidine  gluconate) soap before surgery. CHG is an antiseptic soap that kills germs and continues to kill germs even after washing.   DO NOT use if you have an allergy to chlorhexidine /CHG or antibacterial soaps. If your skin becomes reddened or irritated, stop using the CHG and notify one of our RNs at   Please shower with the CHG soap starting 4 days before surgery using the following schedule:     Please keep in mind the following:  DO NOT shave, including legs and underarms, starting the day of your first shower.   You may shave your face at any point before/day of surgery.  Place clean sheets  on your bed the day you start using CHG soap. Use a clean washcloth (not used since being washed) for each shower. DO NOT sleep with pets once you start using the CHG.  CHG Shower Instructions:  If you choose to wash your hair and private area, wash first with your normal shampoo/soap.  After you use shampoo/soap, rinse your hair and body thoroughly to remove shampoo/soap residue.  Turn the water  OFF and apply about 3 tablespoons (45 ml) of CHG soap to a CLEAN washcloth.  Apply CHG soap ONLY FROM YOUR  NECK DOWN TO YOUR TOES (washing for 3-5 minutes)  DO NOT use CHG soap on face, private areas, open wounds, or sores.  Pay special attention to the area where your surgery is being performed.  If you are having back surgery, having someone wash your back for you may be helpful. Wait 2 minutes after CHG soap is applied, then you may rinse off the CHG soap.  Pat dry with a clean towel  Put on clean clothes/pajamas   If you choose to wear lotion, please use ONLY the CHG-compatible lotions on the back of this paper.     Additional instructions for the day of surgery:  Shower with regular soap the day of surgery DO NOT APPLY any lotions, deodorants, cologne, or perfumes.   Put on clean/comfortable clothes.  Brush your teeth.  Ask your nurse before applying any prescription medications to the skin.   CHG Compatible Lotions   Aveeno Moisturizing lotion  Cetaphil Moisturizing Cream  Cetaphil Moisturizing Lotion  Clairol Herbal Essence Moisturizing Lotion, Dry Skin  Clairol Herbal Essence Moisturizing Lotion, Extra Dry Skin  Clairol Herbal Essence Moisturizing Lotion, Normal Skin  Curel Age Defying Therapeutic Moisturizing Lotion with Alpha Hydroxy  Curel Extreme Care Body Lotion  Curel Soothing Hands Moisturizing Hand Lotion  Curel Therapeutic Moisturizing Cream, Fragrance-Free  Curel Therapeutic Moisturizing Lotion, Fragrance-Free  Curel Therapeutic Moisturizing Lotion, Original Formula  Eucerin Daily Replenishing Lotion  Eucerin Dry Skin Therapy Plus Alpha Hydroxy Crme  Eucerin Dry Skin Therapy Plus Alpha Hydroxy Lotion  Eucerin Original Crme  Eucerin Original Lotion  Eucerin Plus Crme Eucerin Plus Lotion  Eucerin TriLipid Replenishing Lotion  Keri Anti-Bacterial Hand Lotion  Keri Deep Conditioning Original Lotion Dry Skin Formula Softly Scented  Keri Deep Conditioning Original Lotion, Fragrance Free Sensitive Skin Formula  Keri Lotion Fast Absorbing Fragrance Free Sensitive Skin  Formula  Keri Lotion Fast Absorbing Softly Scented Dry Skin Formula  Keri Original Lotion  Keri Skin Renewal Lotion Keri Silky Smooth Lotion  Keri Silky Smooth Sensitive Skin Lotion  Nivea Body Creamy Conditioning Oil  Nivea Body Extra Enriched Lotion  Nivea Body Original Lotion  Nivea Body Sheer Moisturizing Lotion Nivea Crme  Nivea Skin Firming Lotion  NutraDerm 30 Skin Lotion  NutraDerm Skin Lotion  NutraDerm Therapeutic Skin Cream  NutraDerm Therapeutic Skin Lotion  ProShield Protective Hand Cream  Provon moisturizing lotion   PATIENT SIGNATURE_________________________________  NURSE SIGNATURE__________________________________  ________________________________________________________________________    Nasario Exon  An incentive spirometer is a tool that can help keep your lungs clear and active. This tool measures how well you are filling your lungs with each breath. Taking long deep breaths may help reverse or decrease the chance of developing breathing (pulmonary) problems (especially infection) following: A long period of time when you are unable to move or be active. BEFORE THE PROCEDURE  If the spirometer includes an indicator to show your best effort, your nurse or respiratory therapist will set  it to a desired goal. If possible, sit up straight or lean slightly forward. Try not to slouch. Hold the incentive spirometer in an upright position. INSTRUCTIONS FOR USE  Sit on the edge of your bed if possible, or sit up as far as you can in bed or on a chair. Hold the incentive spirometer in an upright position. Breathe out normally. Place the mouthpiece in your mouth and seal your lips tightly around it. Breathe in slowly and as deeply as possible, raising the piston or the ball toward the top of the column. Hold your breath for 3-5 seconds or for as long as possible. Allow the piston or ball to fall to the bottom of the column. Remove the mouthpiece from your  mouth and breathe out normally. Rest for a few seconds and repeat Steps 1 through 7 at least 10 times every 1-2 hours when you are awake. Take your time and take a few normal breaths between deep breaths. The spirometer may include an indicator to show your best effort. Use the indicator as a goal to work toward during each repetition. After each set of 10 deep breaths, practice coughing to be sure your lungs are clear. If you have an incision (the cut made at the time of surgery), support your incision when coughing by placing a pillow or rolled up towels firmly against it. Once you are able to get out of bed, walk around indoors and cough well. You may stop using the incentive spirometer when instructed by your caregiver.  RISKS AND COMPLICATIONS Take your time so you do not get dizzy or light-headed. If you are in pain, you may need to take or ask for pain medication before doing incentive spirometry. It is harder to take a deep breath if you are having pain. AFTER USE Rest and breathe slowly and easily. It can be helpful to keep track of a log of your progress. Your caregiver can provide you with a simple table to help with this. If you are using the spirometer at home, follow these instructions: SEEK MEDICAL CARE IF:  You are having difficultly using the spirometer. You have trouble using the spirometer as often as instructed. Your pain medication is not giving enough relief while using the spirometer. You develop fever of 100.5 F (38.1 C) or higher. SEEK IMMEDIATE MEDICAL CARE IF:  You cough up bloody sputum that had not been present before. You develop fever of 102 F (38.9 C) or greater. You develop worsening pain at or near the incision site. MAKE SURE YOU:  Understand these instructions. Will watch your condition. Will get help right away if you are not doing well or get worse. Document Released: 12/20/2006 Document Revised: 11/01/2011 Document Reviewed: 02/20/2007 [facility]  Patient Information 2014 [place], [place].

## 2024-09-06 NOTE — Progress Notes (Addendum)
 Date of COVID positive in last 90 days:  No  PCP - No PCP, previous PCP left practice Cardiologist - Brian Zagol, MD (notes in media)  Chest x-ray - N/A EKG - 08-30-24 [facility], copy in media Stress Test - Yes, 2020 ECHO - Yes, 2021 Cardiac Cath - Pacemaker/ICD device last checked:N/A Spinal Cord Stimulator:N/A  Bowel Prep - N/A  Sleep Study - Yes,  + sleep apnea CPAP - No  Fasting Blood Sugar - does not check Checks Blood Sugar _____ times a day  Last dose of GLP1 agonist-  N/A GLP1 instructions:  Do not take after     Last dose of SGLT-2 inhibitors-  N/A SGLT-2 instructions:  Do not take after    Blood Thinner Instructions: N/A Last dose:   Time: Aspirin  Instructions:N/A Last Dose:  Activity level:  Can go up a flight of stairs and perform activities of daily living without stopping and without symptoms of chest pain or shortness of breath.  Some limitations due to back and knee pain.  Anesthesia review: Carotid artery stenosis, aortic valve insufficiency, PVD, DM  Patient denies shortness of breath, fever, cough and chest pain at PAT appointment  Patient verbalized understanding of instructions that were given to them at the PAT appointment. Patient was also instructed that they will need to review over the PAT instructions again at home before surgery.

## 2024-09-11 ENCOUNTER — Other Ambulatory Visit: Payer: Self-pay

## 2024-09-11 ENCOUNTER — Encounter (HOSPITAL_COMMUNITY): Payer: Self-pay

## 2024-09-11 ENCOUNTER — Encounter (HOSPITAL_COMMUNITY)
Admission: RE | Admit: 2024-09-11 | Discharge: 2024-09-11 | Disposition: A | Discharge status: Home / Self Care | Source: Ambulatory Visit | Attending: Orthopedic Surgery | Admitting: Orthopedic Surgery

## 2024-09-11 VITALS — BP 133/81 | HR 66 | Temp 98.5°F | Resp 16 | Ht 72.0 in | Wt 196.0 lb

## 2024-09-11 DIAGNOSIS — Z01812 Encounter for preprocedural laboratory examination: Secondary | ICD-10-CM | POA: Diagnosis present

## 2024-09-11 DIAGNOSIS — Z01818 Encounter for other preprocedural examination: Secondary | ICD-10-CM

## 2024-09-11 DIAGNOSIS — M1712 Unilateral primary osteoarthritis, left knee: Secondary | ICD-10-CM | POA: Diagnosis not present

## 2024-09-11 DIAGNOSIS — E119 Type 2 diabetes mellitus without complications: Secondary | ICD-10-CM | POA: Insufficient documentation

## 2024-09-11 LAB — BASIC METABOLIC PANEL WITH GFR
Anion gap: 12 (ref 5–15)
BUN: 24 mg/dL — ABNORMAL HIGH (ref 8–23)
CO2: 24 mmol/L (ref 22–32)
Calcium: 9.9 mg/dL (ref 8.9–10.3)
Chloride: 101 mmol/L (ref 98–111)
Creatinine, Ser: 1.25 mg/dL — ABNORMAL HIGH (ref 0.44–1.00)
GFR, Estimated: 44 mL/min — ABNORMAL LOW
Glucose, Bld: 96 mg/dL (ref 70–99)
Potassium: 4.4 mmol/L (ref 3.5–5.1)
Sodium: 137 mmol/L (ref 135–145)

## 2024-09-11 LAB — CBC
HCT: 40.4 % (ref 36.0–46.0)
Hemoglobin: 12.3 g/dL (ref 12.0–15.0)
MCH: 27.9 pg (ref 26.0–34.0)
MCHC: 30.4 g/dL (ref 30.0–36.0)
MCV: 91.6 fL (ref 80.0–100.0)
Platelets: 259 K/uL (ref 150–400)
RBC: 4.41 MIL/uL (ref 3.87–5.11)
RDW: 15.8 % — ABNORMAL HIGH (ref 11.5–15.5)
WBC: 7.6 K/uL (ref 4.0–10.5)
nRBC: 0 % (ref 0.0–0.2)

## 2024-09-11 LAB — HEMOGLOBIN A1C
Hgb A1c MFr Bld: 6.1 % — ABNORMAL HIGH (ref 4.8–5.6)
Mean Plasma Glucose: 128.37 mg/dL

## 2024-09-11 LAB — SURGICAL PCR SCREEN
MRSA, PCR: NEGATIVE
Staphylococcus aureus: NEGATIVE

## 2024-09-11 LAB — GLUCOSE, CAPILLARY: Glucose-Capillary: 106 mg/dL — ABNORMAL HIGH (ref 70–99)

## 2024-09-11 NOTE — H&P (Signed)
 TOTAL KNEE ADMISSION H&P  Patient is being admitted for left total knee arthroplasty.  Subjective:  Chief Complaint: Left knee pain.  HPI: Phyllis Bell, 79 y.o. female has a history of pain and functional disability in the left knee due to arthritis and has failed non-surgical conservative treatments for greater than 12 weeks to include flexibility and strengthening excercises, supervised PT with diminished ADL's post treatment, and activity modification. Onset of symptoms was gradual, starting several years ago with gradually worsening course since that time. The patient noted no past surgery on the left knee.  Patient currently rates pain in the left knee at 8 out of 10 with activity. Patient has night pain, worsening of pain with activity and weight bearing, pain with passive range of motion, and crepitus. Patient has evidence of advanced bone-on-bone formation in the medial compartment with marginal osteophyte formation by imaging studies. There is no active infection.  Patient Active Problem List   Diagnosis Date Noted   OA (osteoarthritis) of knee 04/30/2024   Spondylolisthesis of lumbar region 09/27/2017   Special screening for malignant neoplasms, colon     Past Medical History:  Diagnosis Date   Arthritis    Cancer (HCC) 2006   Uterine cancer   Diabetes mellitus without complication (HCC)    Type II   GERD (gastroesophageal reflux disease)    Hypercholesteremia    Hypertension    Sleep apnea    no cpap   Spondylolisthesis of lumbar region     Past Surgical History:  Procedure Laterality Date   ABDOMINAL HYSTERECTOMY     APPENDECTOMY     BACK SURGERY  01/2023   rods placed in back  Dr. Olegario in [place]   COLONOSCOPY     COLONOSCOPY N/A 02/18/2016   Procedure: COLONOSCOPY;  Surgeon: Margo LITTIE Haddock, MD;  [facility];  Service: Endoscopy;  Laterality: N/A;  100   EYE SURGERY Bilateral    Incisional hernia repair with mesh     MAXIMUM ACCESS  (MAS)POSTERIOR LUMBAR INTERBODY FUSION (PLIF) 1 LEVEL N/A 09/27/2017   Procedure: Lumbar Four-Five Maximum access posterior lumbar interbody fusion with resection of synovial cyst;  Surgeon: Unice Pac, MD;  Location: [facility] OR;  Service: Neurosurgery;  Laterality: N/A;  Lumbar Four-Five Maximum access posterior lumbar interbody fusion with resection of synovial cyst   TONSILLECTOMY     TOTAL KNEE ARTHROPLASTY Right 04/30/2024   Procedure: ARTHROPLASTY, KNEE, TOTAL;  Surgeon: Melodi Lerner, MD;  Location: [facility];  Service: Orthopedics;  Laterality: Right;    Prior to Admission medications  Medication Sig Start Date End Date Taking? Authorizing Provider  acetaminophen  (TYLENOL ) 500 MG tablet Take 1,000 mg by mouth in the morning and at bedtime.   Yes [provider]  Cyanocobalamin (VITAMIN B-12 PO) Take 1 tablet by mouth daily.   Yes [provider]  docusate sodium  (COLACE) 100 MG capsule Take 100 mg by mouth at bedtime.   Yes [provider]  DULoxetine  (CYMBALTA ) 30 MG capsule Take 30 mg by mouth in the morning.   Yes [provider]  gabapentin  (NEURONTIN ) 300 MG capsule Take 300 mg by mouth in the morning and at bedtime.   Yes [provider]  losartan -hydrochlorothiazide  (HYZAAR) 100-12.5 MG tablet Take 1 tablet by mouth in the morning.   Yes [provider]  metFORMIN  (GLUCOPHAGE ) 500 MG tablet Take 500 mg by mouth 2 (two) times daily. 07/22/17  Yes [provider]  Multiple Vitamin (MULTIVITAMIN WITH MINERALS)  TABS tablet Take 1 tablet by mouth in the morning.   Yes [provider]  Omega-3 Fatty Acids (FISH OIL PO) Take 1 capsule by mouth in the morning.   Yes [provider]  omeprazole (PRILOSEC) 40 MG capsule Take 40 mg by mouth daily before breakfast.   Yes [provider]  simvastatin  (ZOCOR ) 40 MG tablet Take 40 mg by mouth at bedtime.   Yes [provider]  solifenacin (VESICARE) 5 MG  tablet Take 5 mg by mouth in the morning.   Yes [provider]    Allergies[1]  Social History   Socioeconomic History   Marital status: Married    Spouse name: Not on file   Number of children: Not on file   Years of education: Not on file   Highest education level: Not on file  Occupational History   Not on file  Tobacco Use   Smoking status: Former    Current packs/day: 1.00    Average packs/day: 1 pack/day for 20.0 years (20.0 ttl pk-yrs)    Types: Cigarettes   Smokeless tobacco: Never   Tobacco comments:    Quit 30 years ago  Vaping Use   Vaping status: Never Used  Substance and Sexual Activity   Alcohol use: No   Drug use: No   Sexual activity: Not Currently  Other Topics Concern   Not on file  Social History Narrative   Not on file   Social Drivers of Health   Tobacco Use: Low Risk (07/06/2024)   Received from [facility]   Patient History    Smoking Tobacco Use: Never    Smokeless Tobacco Use: Never    Passive Exposure: Never  Recent Concern: Tobacco Use - Medium Risk (04/30/2024)   Patient History    Smoking Tobacco Use: Former    Smokeless Tobacco Use: Never    Passive Exposure: Not on file  Financial Resource Strain: Low Risk (06/06/2024)   Received from [facility]   Overall Financial Resource Strain (CARDIA)    How hard is it for you to pay for the very basics like food, housing, medical care, and heating?: Not hard at all  Food Insecurity: No Food Insecurity (06/06/2024)   Received from [facility]   Epic    Within the past 12 months, you worried that your food would run out before you got the money to buy more.: Never true    Within the past 12 months, the food you bought just didn't last and you didn't have money to get more.: Never true  Transportation Needs: No Transportation Needs (06/06/2024)   Received from [facility]    In the past 12 months, has lack of transportation kept you from medical appointments or from  getting medications?: No    In the past 12 months, has lack of transportation kept you from meetings, work, or from getting things needed for daily living?: No  Physical Activity: Sufficiently Active (06/06/2024)   Received from [facility]   Exercise Vital Sign    On average, how many days per week do you engage in moderate to strenuous exercise (like a brisk walk)?: 3 days    On average, how many minutes do you engage in exercise at this level?: 60 min  Stress: No Stress Concern Present (06/06/2024)   Received from [facility] - Occupational Stress Questionnaire    Do you feel stress - tense, restless, nervous, or  anxious, or unable to sleep at night because your mind is troubled all the time - these days?: Not at all  Social Connections: Socially Integrated (06/06/2024)   Received from [facility]   Social Network    How would you rate your social network (family, work, friends)?: Good participation with social networks  Intimate Partner Violence: Not At Risk (06/06/2024)   Received from [facility]   HITS    Over the last 12 months how often did your partner physically hurt you?: Never    Over the last 12 months how often did your partner insult you or talk down to you?: Never    Over the last 12 months how often did your partner threaten you with physical harm?: Never    Over the last 12 months how often did your partner scream or curse at you?: Never  Depression (PHQ2-9): Not on file  Alcohol Screen: Not on file  Housing: Low Risk (06/06/2024)   Received from [facility]    In the last 12 months, was there a time when you were not able to pay the mortgage or rent on time?: No    In the past 12 months, how many times have you moved where you were living?: 0    At any time in the past 12 months, were you homeless or living in a shelter (including now)?: No  Utilities: Not At Risk (06/06/2024)   Received from [facility]    In the past 12 months has the electric, gas, oil, or water  company threatened to shut off services in your home?: No  Health Literacy: Not on file    Tobacco Use: Low Risk (07/06/2024)   Received from [facility]   Patient History    Smoking Tobacco Use: Never    Smokeless Tobacco Use: Never    Passive Exposure: Never  Recent Concern: Tobacco Use - Medium Risk (04/30/2024)   Patient History    Smoking Tobacco Use: Former    Smokeless Tobacco Use: Never    Passive Exposure: Not on file   Social History   Substance and Sexual Activity  Alcohol Use No    No family history on file.  Review of Systems  Constitutional:  Negative for chills and fever.  HENT:  Negative for congestion, sore throat and tinnitus.   Eyes:  Negative for double vision, photophobia and pain.  Respiratory:  Negative for cough, shortness of breath and wheezing.   Cardiovascular:  Negative for chest pain, palpitations and orthopnea.  Gastrointestinal:  Negative for heartburn, nausea and vomiting.  Genitourinary:  Negative for dysuria, frequency and urgency.  Musculoskeletal:  Positive for joint pain.  Neurological:  Negative for dizziness, weakness and headaches.    Objective:  Physical Exam: Well nourished and well developed.  General: Alert and oriented x3, cooperative and pleasant, no acute distress.  Head: normocephalic, atraumatic, neck supple.  Eyes: EOMI.  Musculoskeletal:  Left knee: Range of motion is 10 to 120 degrees with marked crepitus on range of motion. Tenderness medially with slight lateral tenderness. No instability. Significant limp on the left side.  Calves soft and nontender. Motor function intact in LE. Strength 5/5 LE bilaterally. Neuro: Distal pulses 2+. Sensation to light touch intact in LE.  Imaging Review Plain radiographs demonstrate severe degenerative joint disease of the left knee. The overall alignment is neutral. The bone quality appears to be adequate for  age and reported activity level.  Assessment/Plan:  End stage arthritis, left knee   The patient history, physical examination, clinical judgment of the provider and imaging studies are consistent with end stage degenerative joint disease of the left knee and total knee arthroplasty is deemed medically necessary. The treatment options including medical management, injection therapy arthroscopy and arthroplasty were discussed at length. The risks and benefits of total knee arthroplasty were presented and reviewed. The risks due to aseptic loosening, infection, stiffness, patella tracking problems, thromboembolic complications and other imponderables were discussed. The patient acknowledged the explanation, agreed to proceed with the plan and consent was signed. Patient is being admitted for inpatient treatment for surgery, pain control, PT, OT, prophylactic antibiotics, VTE prophylaxis, progressive ambulation and ADLs and discharge planning. The patient is planning to be discharged home.   Patient's anticipated LOS is less than 2 midnights, meeting these requirements: - Lives within 1 hour of care - Has a competent adult at home to recover with post-op recover - NO history of  - Chronic pain requiring opioids  - Coronary Artery Disease  - Heart failure  - Heart attack  - Stroke  - DVT/VTE  - Cardiac arrhythmia  - Respiratory Failure/COPD  - Renal failure  - Anemia  - Advanced Liver disease  Therapy Plans: Outpatient therapy at [facility] Disposition: Home with husband and daughter Planned DVT Prophylaxis: Aspirin  81 mg BID DME Needed: None PCP: Dr. Diedra (clearance from 04/2024) Cardiologist: Brian Zagol, MD (clearance received) TXA: IV Allergies: NKDA Metal Allergy: No Anesthesia Concerns:  BMI: 27.9 Last HgbA1c: 6.0% (04/2024) Pain Regimen: Oxycodone  Ph[facility] [place])  Other: - Xarelto  cost over $300 with previous TKA, will plan for ASA postop - Only  needs aspirin  instructions (has at home) and oxycodone  sent at discharge. Already has methocarbamol  and zofran  at home from right TKA.   - Patient was instructed on what medications to stop prior to surgery. - Follow-up visit in 2 weeks with Dr. Melodi - Begin physical therapy following surgery - Pre-operative lab work as pre-surgical testing - Prescriptions will be provided in hospital at time of discharge  Roxie Mess, PA-C Orthopedic Surgery [facility] [place]       [1] No Known Allergies

## 2024-09-16 NOTE — Progress Notes (Signed)
 " Case: [id number] Date/Time: 09/24/24 1325   Procedure: ARTHROPLASTY, KNEE, TOTAL (Left: Knee)   Anesthesia type: Choice   Pre-op diagnosis: Left knee osteoarthritis   [facility] 09 / [facility]   Surgeons: Phyllis Lerner, MD       DISCUSSION: Phyllis [name] is a 79 yo [name] with PMH of former smoking, HTN, aortic atherosclerosis, mild AI, OSA (CPAP intolerance), GERD, T2DM, uterine cancer s/p hysterectomy, s/p multiple back surgeries with thoracolumbar fusion from T8-S1, arthritis.  Patient underwent R TKA on 04/30/24 under spinal anesthesia which was without complications.  Patient follows with Cardiology in [place]. Last seen by Dr. Zagol on 08/30/24. Prior w/u includes Echocardiogram in December 2020 which revealed normal left ventricular size, wall thickness, wall motion, and function, EF 60-65%, mild grade 1 diastolic dysfunction, normal RV size and function, normal PA pressure, and mild AI, MR, TR, and PI. And adenosine Cardiolite stress test in January 2021 which revealed normal perfusion and no ischemia with EF 70%. Noted to be doing well from cardiac perspective. She denied any cardiac symptoms. Main complaint was back pain. She was advised to continue current meds and f/u in 1 year.  Of note EKG tracing from cardiology clinic on 08/30/24 was read as junctional rhythm however per Dr. Zagol: NSR at 68bpm. LAFB. Unchanged compared to multiple prior EKGs  VS: BP 133/81   Pulse 66   Temp 36.9 C (Oral)   Resp 16   Ht 6' (1.829 m)   Wt 88.9 kg   SpO2 98%   BMI 26.58 kg/m   PROVIDERS: Patient, No Pcp Per   LABS: Labs reviewed: Acceptable for surgery. (all labs ordered are listed, but only abnormal results are displayed)  Labs Reviewed  HEMOGLOBIN A1C - Abnormal; Notable for the following components:      Result Value   Hgb A1c MFr Bld 6.1 (*)    All other components within normal limits  BASIC METABOLIC PANEL WITH GFR - Abnormal; Notable for the following components:   BUN 24  (*)    Creatinine, Ser 1.25 (*)    GFR, Estimated 44 (*)    All other components within normal limits  CBC - Abnormal; Notable for the following components:   RDW 15.8 (*)    All other components within normal limits  GLUCOSE, CAPILLARY - Abnormal; Notable for the following components:   Glucose-Capillary 106 (*)    All other components within normal limits  SURGICAL PCR SCREEN     Xray T/L spine 01/31/2023 ([facility]):  FINDINGS:  Bipedicular screws posteriorly fuse T8 through S1, bilateral sacroiliac fusion and interbody fusion from L2-3 through L5-1. No evidence of hardware failure. There is levoconvex scoliosis upper thoracic spine, broad dextroconvex scoliosis lower thoracic upper lumbar spine and mild wedging at T5 and T6.   Past Medical History:  Diagnosis Date   Arthritis    Cancer (HCC) 2006   Uterine cancer   Diabetes mellitus without complication (HCC)    Type II   GERD (gastroesophageal reflux disease)    Hypercholesteremia    Hypertension    Sleep apnea    no cpap   Spondylolisthesis of lumbar region     Past Surgical History:  Procedure Laterality Date   ABDOMINAL HYSTERECTOMY     APPENDECTOMY     BACK SURGERY  01/2023   rods placed in back  Dr. Olegario in [place]   COLONOSCOPY     COLONOSCOPY N/A 02/18/2016   Procedure: COLONOSCOPY;  Surgeon: Phyllis CROME  Fields, MD;  [facility];  Service: Endoscopy;  Laterality: N/A;  100   EYE SURGERY Bilateral    Incisional hernia repair with mesh     MAXIMUM ACCESS (MAS)POSTERIOR LUMBAR INTERBODY FUSION (PLIF) 1 LEVEL N/A 09/27/2017   Procedure: Lumbar Four-Five Maximum access posterior lumbar interbody fusion with resection of synovial cyst;  Surgeon: Phyllis Pac, MD;  Location: [facility] OR;  Service: Neurosurgery;  Laterality: N/A;  Lumbar Four-Five Maximum access posterior lumbar interbody fusion with resection of synovial cyst   TONSILLECTOMY     TOTAL KNEE ARTHROPLASTY Right 04/30/2024   Procedure: ARTHROPLASTY,  KNEE, TOTAL;  Surgeon: Phyllis Lerner, MD;  Location[facility];  Service: Orthopedics;  Laterality: Right;    MEDICATIONS:  acetaminophen  (TYLENOL ) 500 MG tablet   Cyanocobalamin (VITAMIN B-12 PO)   docusate sodium  (COLACE) 100 MG capsule   DULoxetine  (CYMBALTA ) 30 MG capsule   gabapentin  (NEURONTIN ) 300 MG capsule   losartan -hydrochlorothiazide  (HYZAAR) 100-12.5 MG tablet   metFORMIN  (GLUCOPHAGE ) 500 MG tablet   Multiple Vitamin (MULTIVITAMIN WITH MINERALS) TABS tablet   Omega-3 Fatty Acids (FISH OIL PO)   omeprazole (PRILOSEC) 40 MG capsule   simvastatin  (ZOCOR ) 40 MG tablet   solifenacin (VESICARE) 5 MG tablet   No current facility-administered medications for this encounter.   Phyllis [name] Phyllis [name] [facility] Short Stay/Anesthesiology [facility] Phone [phone] 09/16/2024 1:33 PM      "

## 2024-09-16 NOTE — Anesthesia Preprocedure Evaluation (Signed)
"                                    Anesthesia Evaluation    Airway        Dental   Pulmonary former smoker          Cardiovascular hypertension,      Neuro/Psych    GI/Hepatic   Endo/Other  diabetes    Renal/GU      Musculoskeletal   Abdominal   Peds  Hematology   Anesthesia Other Findings   Reproductive/Obstetrics                              Anesthesia Physical Anesthesia Plan  ASA:   Anesthesia Plan:    Post-op Pain Management:    Induction:   PONV Risk Score and Plan:   Airway Management Planned:   Additional Equipment:   Intra-op Plan:   Post-operative Plan:   Informed Consent:   Plan Discussed with:   Anesthesia Plan Comments: (See PAT note from 1/20)         Anesthesia Quick Evaluation  "

## 2024-09-24 DIAGNOSIS — M1712 Unilateral primary osteoarthritis, left knee: Secondary | ICD-10-CM

## 2024-10-03 ENCOUNTER — Encounter (HOSPITAL_COMMUNITY): Admission: RE | Admit: 2024-10-03

## 2024-10-15 ENCOUNTER — Ambulatory Visit (HOSPITAL_COMMUNITY): Admission: RE | Admit: 2024-10-15 | Source: Ambulatory Visit | Admitting: Orthopedic Surgery

## 2024-10-15 ENCOUNTER — Encounter (HOSPITAL_COMMUNITY): Admission: RE | Payer: Self-pay | Source: Ambulatory Visit

## 2024-10-15 ENCOUNTER — Encounter (HOSPITAL_COMMUNITY): Payer: Self-pay | Admitting: Medical

## 2024-10-15 DIAGNOSIS — M1712 Unilateral primary osteoarthritis, left knee: Secondary | ICD-10-CM
